# Patient Record
Sex: Male | Born: 1960 | Race: Black or African American | Hispanic: No | Marital: Married | Smoking: Current every day smoker
Health system: Southern US, Community
[De-identification: ages and names within clinical notes are randomized; demographics above are authoritative.]

## PROBLEM LIST (undated history)

## (undated) DIAGNOSIS — I10 Essential (primary) hypertension: Secondary | ICD-10-CM

## (undated) DIAGNOSIS — F17200 Nicotine dependence, unspecified, uncomplicated: Secondary | ICD-10-CM

## (undated) DIAGNOSIS — M1990 Unspecified osteoarthritis, unspecified site: Secondary | ICD-10-CM

## (undated) DIAGNOSIS — I639 Cerebral infarction, unspecified: Secondary | ICD-10-CM

## (undated) DIAGNOSIS — R011 Cardiac murmur, unspecified: Secondary | ICD-10-CM

## (undated) DIAGNOSIS — F1010 Alcohol abuse, uncomplicated: Secondary | ICD-10-CM

## (undated) HISTORY — PX: CARDIAC SURGERY: SHX584

## (undated) HISTORY — PX: HERNIA REPAIR: SHX51

## (undated) HISTORY — PX: BACK SURGERY: SHX140

---
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
## 1999-05-09 ENCOUNTER — Observation Stay (HOSPITAL_COMMUNITY): Admission: RE | Admit: 1999-05-09 | Discharge: 1999-05-10 | Payer: Self-pay | Admitting: Neurosurgery

## 1999-05-09 ENCOUNTER — Encounter: Payer: Self-pay | Admitting: Neurosurgery

## 2000-02-17 ENCOUNTER — Emergency Department (HOSPITAL_COMMUNITY): Admission: EM | Admit: 2000-02-17 | Discharge: 2000-02-18 | Payer: Self-pay | Admitting: Emergency Medicine

## 2007-08-02 ENCOUNTER — Emergency Department (HOSPITAL_COMMUNITY): Admission: EM | Admit: 2007-08-02 | Discharge: 2007-08-02 | Payer: Self-pay | Admitting: Emergency Medicine

## 2008-04-19 ENCOUNTER — Emergency Department (HOSPITAL_COMMUNITY): Admission: EM | Admit: 2008-04-19 | Discharge: 2008-04-19 | Payer: Self-pay | Admitting: Emergency Medicine

## 2008-09-25 ENCOUNTER — Emergency Department (HOSPITAL_COMMUNITY): Admission: EM | Admit: 2008-09-25 | Discharge: 2008-09-26 | Payer: Self-pay | Admitting: Emergency Medicine

## 2009-03-08 ENCOUNTER — Inpatient Hospital Stay (HOSPITAL_COMMUNITY): Admission: AD | Admit: 2009-03-08 | Discharge: 2009-03-12 | Payer: Self-pay | Admitting: Neurology

## 2009-03-08 ENCOUNTER — Encounter: Payer: Self-pay | Admitting: Emergency Medicine

## 2009-03-08 ENCOUNTER — Ambulatory Visit: Payer: Self-pay | Admitting: Cardiology

## 2009-03-09 ENCOUNTER — Encounter (INDEPENDENT_AMBULATORY_CARE_PROVIDER_SITE_OTHER): Payer: Self-pay | Admitting: Neurology

## 2009-03-10 ENCOUNTER — Encounter: Payer: Self-pay | Admitting: Cardiology

## 2009-03-11 ENCOUNTER — Ambulatory Visit: Payer: Self-pay | Admitting: *Deleted

## 2009-03-11 ENCOUNTER — Encounter (INDEPENDENT_AMBULATORY_CARE_PROVIDER_SITE_OTHER): Payer: Self-pay | Admitting: Neurology

## 2009-04-06 DIAGNOSIS — I6350 Cerebral infarction due to unspecified occlusion or stenosis of unspecified cerebral artery: Secondary | ICD-10-CM | POA: Insufficient documentation

## 2009-04-06 DIAGNOSIS — F17200 Nicotine dependence, unspecified, uncomplicated: Secondary | ICD-10-CM

## 2009-04-06 DIAGNOSIS — I669 Occlusion and stenosis of unspecified cerebral artery: Secondary | ICD-10-CM | POA: Insufficient documentation

## 2009-04-08 ENCOUNTER — Ambulatory Visit: Payer: Self-pay | Admitting: Cardiology

## 2009-04-08 DIAGNOSIS — Q249 Congenital malformation of heart, unspecified: Secondary | ICD-10-CM | POA: Insufficient documentation

## 2009-04-18 ENCOUNTER — Telehealth (INDEPENDENT_AMBULATORY_CARE_PROVIDER_SITE_OTHER): Payer: Self-pay | Admitting: *Deleted

## 2009-05-03 ENCOUNTER — Telehealth (INDEPENDENT_AMBULATORY_CARE_PROVIDER_SITE_OTHER): Payer: Self-pay | Admitting: *Deleted

## 2010-04-12 ENCOUNTER — Emergency Department (HOSPITAL_COMMUNITY): Admission: EM | Admit: 2010-04-12 | Discharge: 2010-04-12 | Payer: Self-pay | Admitting: Emergency Medicine

## 2010-12-25 LAB — CARDIOLIPIN ANTIBODIES, IGG, IGM, IGA
Anticardiolipin IgA: 9 [APL'U] — ABNORMAL LOW (ref <13)
Anticardiolipin IgG: <7 [GPL'U] — ABNORMAL LOW (ref <11)
Anticardiolipin IgM: <7 [MPL'U] — ABNORMAL LOW (ref <10)

## 2010-12-25 LAB — DIFFERENTIAL
Basophils Absolute: 0 10*3/uL (ref 0.0–0.1)
Lymphocytes Relative: 35 % (ref 12–46)
Neutro Abs: 2.2 10*3/uL (ref 1.7–7.7)

## 2010-12-25 LAB — ANA: Anti Nuclear Antibody(ANA): NEGATIVE

## 2010-12-25 LAB — POCT I-STAT 3, VENOUS BLOOD GAS (G3P V)
Acid-base deficit: 3 mmol/L — ABNORMAL HIGH (ref 0.0–2.0)
Acid-base deficit: 3 mmol/L — ABNORMAL HIGH (ref 0.0–2.0)
Acid-base deficit: 4 mmol/L — ABNORMAL HIGH (ref 0.0–2.0)
Bicarbonate: 19.9 mEq/L — ABNORMAL LOW (ref 20.0–24.0)
Bicarbonate: 22.7 mEq/L (ref 20.0–24.0)
O2 Saturation: 68 %
O2 Saturation: 69 %
O2 Saturation: 73 %
TCO2: 20 mmol/L (ref 0–100)
TCO2: 21 mmol/L (ref 0–100)
TCO2: 24 mmol/L (ref 0–100)
TCO2: 24 mmol/L (ref 0–100)
TCO2: 24 mmol/L (ref 0–100)
pCO2, Ven: 41.5 mmHg — ABNORMAL LOW (ref 45.0–50.0)
pCO2, Ven: 41.9 mmHg — ABNORMAL LOW (ref 45.0–50.0)
pCO2, Ven: 42 mmHg — ABNORMAL LOW (ref 45.0–50.0)
pH, Ven: 7.251 (ref 7.250–7.300)
pH, Ven: 7.288 (ref 7.250–7.300)
pH, Ven: 7.327 — ABNORMAL HIGH (ref 7.250–7.300)
pH, Ven: 7.351 — ABNORMAL HIGH (ref 7.250–7.300)
pO2, Ven: 36 mmHg (ref 30.0–45.0)
pO2, Ven: 38 mmHg (ref 30.0–45.0)
pO2, Ven: 39 mmHg (ref 30.0–45.0)

## 2010-12-25 LAB — LUPUS ANTICOAGULANT PANEL: Lupus Anticoagulant: NOT DETECTED

## 2010-12-25 LAB — ANTITHROMBIN III: AntiThromb III Func: 90 % (ref 76–126)

## 2010-12-25 LAB — CBC
HCT: 47.5 % (ref 39.0–52.0)
MCV: 102.7 fL — ABNORMAL HIGH (ref 78.0–100.0)
RBC: 4.63 MIL/uL (ref 4.22–5.81)
WBC: 4.1 10*3/uL (ref 4.0–10.5)

## 2010-12-25 LAB — APTT: aPTT: 31 seconds (ref 24–37)

## 2010-12-25 LAB — COMPREHENSIVE METABOLIC PANEL
BUN: 8 mg/dL (ref 6–23)
CO2: 24 mEq/L (ref 19–32)
Chloride: 107 mEq/L (ref 96–112)
Creatinine, Ser: 1.04 mg/dL (ref 0.4–1.5)
GFR calc non Af Amer: >60 mL/min (ref >60)
Total Bilirubin: 1.7 mg/dL — ABNORMAL HIGH (ref 0.3–1.2)

## 2010-12-25 LAB — PROTIME-INR
INR: 1 (ref 0.00–1.49)
INR: 0.9 (ref 0.00–1.49)

## 2010-12-25 LAB — HEMOGLOBIN A1C
Hgb A1c MFr Bld: 4.9 % (ref 4.6–6.1)
Mean Plasma Glucose: 94 mg/dL

## 2010-12-25 LAB — SEDIMENTATION RATE: Sed Rate: 2 mm/hr (ref 0–16)

## 2010-12-25 LAB — RAPID URINE DRUG SCREEN, HOSP PERFORMED: Barbiturates: NOT DETECTED

## 2010-12-25 LAB — D-DIMER, QUANTITATIVE: D-Dimer, Quant: 0.41 ug/mL-FEU (ref 0.00–0.48)

## 2010-12-25 LAB — BRAIN NATRIURETIC PEPTIDE: Pro B Natriuretic peptide (BNP): <30 pg/mL (ref 0.0–100.0)

## 2010-12-25 LAB — LIPID PANEL: Cholesterol: 130 mg/dL (ref 0–200)

## 2011-01-01 LAB — DIFFERENTIAL
Basophils Absolute: 0.1 10*3/uL (ref 0.0–0.1)
Basophils Relative: 1 % (ref 0–1)
Eosinophils Absolute: 0.2 10*3/uL (ref 0.0–0.7)
Monocytes Absolute: 0.3 10*3/uL (ref 0.1–1.0)
Neutro Abs: 1.6 10*3/uL — ABNORMAL LOW (ref 1.7–7.7)
Neutrophils Relative %: 34 % — ABNORMAL LOW (ref 43–77)

## 2011-01-01 LAB — CBC
Hemoglobin: 16 g/dL (ref 13.0–17.0)
MCHC: 33.1 g/dL (ref 30.0–36.0)
RDW: 13.6 % (ref 11.5–15.5)

## 2011-01-01 LAB — POCT CARDIAC MARKERS
Troponin i, poc: <0.05 ng/mL (ref 0.00–0.09)
Troponin i, poc: <0.05 ng/mL (ref 0.00–0.09)

## 2011-01-01 LAB — POCT I-STAT, CHEM 8
Calcium, Ion: 1.07 mmol/L — ABNORMAL LOW (ref 1.12–1.32)
Chloride: 106 mEq/L (ref 96–112)
HCT: 52 % (ref 39.0–52.0)
Hemoglobin: 17.7 g/dL — ABNORMAL HIGH (ref 13.0–17.0)

## 2011-01-30 NOTE — Discharge Summary (Signed)
NAMETAYDEN, Rodney Buchanan             ACCOUNT NO.:  [id number]   MEDICAL RECORD NO.:  [id number]          PATIENT TYPE:  INP   LOCATION:  3012                         FACILITY:  [facility]   PHYSICIAN:  Levert Feinstein, MD          DATE OF BIRTH:  September 09, 1961   DATE OF ADMISSION:  03/08/2009  DATE OF DISCHARGE:  03/12/2009                               DISCHARGE SUMMARY   DISCHARGE DIAGNOSES:  1. Left insular cortex stroke.  2. Left distal middle cerebral artery occlusion.  3. History of mitral valve replacement, not on any anticoagulation.  4. Hypertension.  5. Tobacco abuse.   DISCHARGE MEDICATIONS:  1. Aspirin 325 mg every day.  2. Nicotine patch.   HOSPITAL COURSE:  The patient is a 50 year old right-handed African  American male, who has mitral valve replacement at [place], presenting  acute onset of right-sided weakness, numbness, facial asymmetry, slurred  speech at 11 o'clock on March 08, 2009, symptoms resolved in 10 minutes,  but with mild slurred speech and facial weakness, persistent.   Upon admission, MRI of the brain has demonstrated acute stroke involving  the left insular cortex.  There was also evidence of distal left MCA  branch occlusion, especially the left posterior sylvian division  occluded 5 mm beyond its origin.  A 2-D echo has demonstrated ejection  fraction of 55-60%, and the septum was broad from the right to left  consistent with increased right atrial pressure, he later underwent TEE  with positive bubble study.  Also, left atrium was mildly dilated, right  atrium was moderately dilated, this has leading to the MRA of the  cardiac morphology.  MRI of the cardiac morphology has demonstrated  moderately dilated right ventricle with normal systolic function and  moderate right atrial enlargement.  There seems to have flow across the  intra-atrial septum, but does not seem to be due to AFO.  This has  leading to the right heart cardiac catheterization by Dr. Marca Ancona.  There is no significant step-up in oxygen saturation to suggest an  enlarged left to right shunt, the right atrium pressure is elevated, in  the absence of pulmonary hypertension.  The left atrium to right atrium  connection appeared to be small, and the Cardiology is going to get  records from his previous surgical evaluation in [place] for  comparison.   Upon discharge, the patient denied any focal neurological signs,  nonfocal neurological examination, and he is discharged home with  aspirin, nicotine patch for smoking cessation.  Follow up with Dr. Pearlean Brownie  and also cardiologist, Dr. Marca Ancona in 1-2 months.      Levert Feinstein, MD  Electronically Signed    YY/MEDQ  D:  03/12/2009  T:  03/12/2009  Job:  [id number]

## 2011-01-30 NOTE — Cardiovascular Report (Signed)
Rodney Buchanan, Rodney Buchanan             ACCOUNT NO.:  [id number]   MEDICAL RECORD NO.:  [id number]          PATIENT TYPE:  INP   LOCATION:  3012                         FACILITY:  [facility]   PHYSICIAN:  Marca Ancona, MD      DATE OF BIRTH:  03-03-1961   DATE OF PROCEDURE:  03/11/2009  DATE OF DISCHARGE:                            CARDIAC CATHETERIZATION   PROCEDURE:  Right heart catheterization with shunt run.   INDICATION:  Question ASD in the patient with enlarged right side of his  heart and positive bubble study and TEE.   PROCEDURE NOTE:  After informed consent was obtained, the right groin  was sterilely prepped and draped.  A 1% lidocaine was used to locally  anesthetize the right groin area.  A 7-French venous sheath was placed  using Seldinger technique in the right common femoral vein.  Right heart  catheterization was carried out using a balloon-tip Swan-Ganz catheter.  Samples were removed for oxygen saturation from the pulmonary artery,  the right atrium, the IVC.   FINDINGS:  1. Hemodynamics:  Mean right atrial pressure of 11 mmHg, RV 33/15, PA      30/12 for a mean PA pressure of 18 mmHg.  Mean pulmonary capillary      wedge pressure of 14 mmHg.  2. Oxygen saturations:  Aortic saturation is 96%, PA saturation is      71%, mid right atrial saturation is 68%, IVC saturation is 73%.  3. Cardiac output is 4 L/minute, cardiac index 2.16, Qp/Qs was      calculated to be 1.1 to 1.  4. Morphology of the right atrium, right ventricular dilated.   IMPRESSION:  There does not appear to be a significant step-up in oxygen  saturation suggesting that there is no significant left-to-right shunt.  The right atrial pressure is elevated in the absence of pulmonary  hypertension.  The left atrial to right atrial connection appears to be  small, it is either a small fenestration-type atrial septal defect or a  patent foramen ovale; however, it would be an unusual location for  patent  foramen ovale.  I think it would be okay to discharge the patient  today.  We will see him in follow up and review his records from his  surgery in [place].      Marca Ancona, MD  Electronically Signed     DM/MEDQ  D:  03/11/2009  T:  03/12/2009  Job:  [id number]

## 2011-01-30 NOTE — H&P (Signed)
Rodney Buchanan, Rodney Buchanan             ACCOUNT NO.:  [id number]   MEDICAL RECORD NO.:  [id number]          PATIENT TYPE:  INP   LOCATION:  3012                         FACILITY:  [facility]   PHYSICIAN:  Rodney P. Pearlean Brownie, MD    DATE OF BIRTH:  Jun 20, 1961   DATE OF ADMISSION:  03/08/2009  DATE OF DISCHARGE:                              HISTORY & PHYSICAL   REASON FOR REFERRAL:  TIA.   HISTORY OF PRESENT ILLNESS:  Rodney Buchanan is a 50 year old African-  American gentleman who developed the sudden onset of right-sided  weakness, numbness, slurred speech and facial asymmetry at 11:00 a.m.  today.  His symptoms started resolving in 10 minutes, and the right-  sided weakness and numbness improved, but his slurred speech and facial  weakness persisted.  He came to the emergency room and was found to be  improving by the ER physician.  CT scan of the head on admission showed  possible clot in the distal left middle cerebral artery with no acute  abnormality.  The patient has no prior history of stroke, TIA, seizures  or significant neurological problems.   PAST MEDICAL HISTORY:  1. Mitral valve replacement, but he is not on Coumadin.  He is not on      any medications.  2. Hypertension.  3. Smoking 1-1/2 packs per day.   HOME MEDICATIONS:  None.   MEDICATION ALLERGIES:  None.   FAMILY HISTORY:  Noncontributory.   SOCIAL HISTORY:  Smokes 1-1/2 packs per day.  He drinks alcohol  occasionally.  Denies doing drugs.   REVIEW OF SYSTEMS:  Negative for recent fever, cough, chest pain,  diarrhea or other illness.   PHYSICAL EXAMINATION:  GENERAL:  A pleasant middle-aged African-American  healthy-looking male who currently is not in distress.  VITAL SIGNS:  Temperature 98.6, blood pressure 124/79, heart rate 85 per  minute, respiratory rate 20 per minute, saturation 92% on room air.  HEENT:  Head is nontraumatic.  ENT exam is unremarkable.  NECK:  Supple.  There is no bruit.  CARDIAC:  No murmur or  gallop.  LUNGS:  Clear to auscultation.  ABDOMEN:  Soft, nontender.  NEUROLOGIC:  The patient is pleasant, awake, alert, cooperative.  There  is no aphasia, apraxia.  He does have mild dysarthria.  Eye movements  are full range.  There is no nystagmus.  Visual acuity and fields are  adequate.  He has right nasolabial fold asymmetry, facial weakness when  he smiles.  Tongue is midline.  Palatal movements normal.  Motor system  exam reveals no upper or lower extremity drift.  He has symmetric  strength, tone, reflexes and sensation.  Coordination is slightly  impaired in the right hand.  He has weakness of the right hand intrinsic  hand muscles with fine finger movement diminished.  He does not  __________ left or right upper extremity.  Gait was not tested.   DATA REVIEWED:  Noncontrast CAT scan of the head done today revealed  some hyperdensity in the left distal sylvian fissure, probably a clot.  No acute stroke is noted.   IMPRESSION:  A 50 year old gentleman with a left middle cerebral artery  branch infarction, likely a distal MCA clot.  The patient has obtained  near substantial improvement but has mild residual facial asymmetry and  slurred speech.  The patient had presented beyond 3 hours but within 12  hours of onset of his symptoms.   PLAN:  The patient is being admitted to [facility] for further  stroke workup.  We will check MRI scan of the brain with MRA of the  brain, carotid ultrasound, 2-D echocardiogram, fasting lipid profile,  hemoglobin A1c and homocysteine.  He may consider participating in the  POINT stroke trial.  I have discussed this with him and he expressed  some interest.  We will see if he meets inclusion criteria to qualify.  He will need to be on antiplatelet therapy, as well as aggressive risk  factor modification.  Will need to obtain more information about his  mitral valve replacement to see if he merits long-term anticoagulation  or  antiplatelet therapy.           ______________________________  Sunny Schlein. Pearlean Brownie, MD     PPS/MEDQ  D:  03/08/2009  T:  03/09/2009  Job:  [id number]

## 2011-01-30 NOTE — Discharge Summary (Signed)
NAMESHIELDS, PAUTZ             ACCOUNT NO.:  [id number]   MEDICAL RECORD NO.:  [id number]          PATIENT TYPE:  INP   LOCATION:  3012                         FACILITY:  [facility]   PHYSICIAN:  Marca Ancona, MD      DATE OF BIRTH:  18-Nov-1960   DATE OF ADMISSION:  03/08/2009  DATE OF DISCHARGE:                               DISCHARGE SUMMARY   This is completed to be available for right heart catheterization  completed within the next hour.   PRIMARY CARDIOLOGIST:  Marca Ancona, MD   PRIMARY CARE PHYSICIAN:  Not listed.   REQUESTING PHYSICIAN:  Pramod P. Pearlean Brownie, MD, Neurology.   REASON FOR CONSULTATION:  CVA status post TEE needing right heart  catheterization for right heart enlargement.   HISTORY OF PRESENT ILLNESS:  This is a 50 year old African American male  with no history of coronary artery disease, but a remote history of  mitral valve repair at the age of 28 in [place] who had sudden onset of  right-sided weakness, numbness, slurred speech, and facial asymmetry  around 11 a.m.  The patient's symptoms resolved approximately 10 minutes  later with right-sided weakness and numbness improving, but the slurred  speech and facial weakness persisted, therefore he was seen in the  emergency room at [facility].  The patient on arrival, his  blood pressure was 124/79, heart rate of 85.  The patient had neuro  consult concerning this and he had a CT head completed revealing slight  increased density within the left MCA within the sylvian fissure.  The  patient could not be excluded for MCA signed thrombus.  He had a  subsequent MR of the brain, which revealed occlusion or near-occlusion  of the left MCA, M2 segment posterior psyllium branch with poor flow  downstream vessels, otherwise negative intracranial MRA.   The patient was transferred to [facility] where he was seen and  examined by Dr. Pearlean Brownie and Cardiology was requested to do a TEE in the  setting of  a CVA.   Dr. Shirlee Latch did complete a TEE for this patient on March 10, 2009, with  results revealing right-sided heart enlargement, cavity size dilated,  systolic function mildly reduced, right atrium moderately dilated,  atrial septum both from right to left consistent with increased right  atrial pressure, mitral valve calcified annulus, and mitral mild  regurgitation.  The LVEF is 55%.  He did have a positive bubble study  and right heart catheterization was recommended.   REVIEW OF SYSTEMS:  Positive for right-sided weakness and slurred speech  with numbness and tingling on the right side with sudden onset, negative  for chest pain, shortness of breath, nausea, vomiting, or diaphoresis.  All other systems have been reviewed and found to be negative.   PAST MEDICAL HISTORY:  1. Hypertension.  2. Mitral valve repair as a child in [place].  3. Ongoing tobacco abuse.   SOCIAL HISTORY:  He lives in [place].  He is a long-time smoker.  Positive for EtOH, negative for drug use.   FAMILY HISTORY:  Not available at this time.  CURRENT MEDICATIONS:  Aspirin 325 mg daily, low-molecular-weight heparin  daily.   ALLERGIES:  No known drug allergies.   CURRENT LABORATORIES:  Sodium 140, potassium 4.2, chloride 107, CO2 of  24, BUN 8, creatinine 1.0, glucose 87.  Hemoglobin 16.2, hematocrit  47.5, white blood cells 4.2, platelets 238.  D-dimer 0.41, hemoglobin  A1c 4.9.  BNP less than 30.  Troponin less than 0.05 respectively.  PTT  31.  PT 13.6, INR 1.0.  Chest x-ray revealing no active disease.   PHYSICAL EXAMINATION:  VITAL SIGNS:  Blood pressure 132/81, pulse 41,  respirations 18, temperature 99, O2 sat 96% on room air.  GENERAL:  He is awake, alert, and oriented.  HEENT:  Head is normocephalic and atraumatic.  Eyes PERRLA.  Mucous  membranes, mouth pink and moist.  Tongue is midline.  NECK:  Supple.  No JVD.  No carotid bruits are appreciated.  CARDIOVASCULAR:  Regular rate and  rhythm without evidence of murmurs,  rubs, or gallops at present.  LUNGS:  Clear to auscultation.  ABDOMEN:  Soft, nontender with 2+ bowel sounds.  EXTREMITIES:  Without clubbing, cyanosis, edema, or weakness at this  time.   IMPRESSION:  1. Status post cerebrovascular accident with right-sided weakness.  2. Status post TEE with right heart enlargement.  3. Hypertension.  4. Ongoing tobacco abuse.   PLAN:  This is a very pleasant 50 year old African American male with  known history of hypertension and mitral valve repair as a child, but no  history of coronary artery disease.  The patient was seen and examined  by Dr. Marca Ancona and a TEE was completed on March 10, 2009, revealing  right-sided heart enlargement, bubble study positive, the bubble did not  cross in the typical area for PFO, but we were unable to localize the  defect.  Cardiac MRI was done to follow up and this confirmed right-  sided dilatation.  Again, there is no suggestion of a flow across the  intra-atrial septum, but I am unable to locate a discrete lesion,  questionable small fenestrations within the atrial septum.   It is our recommendation that he needs a right heart catheterization  with shunt given right-sided dilatation, stroke, and shunt lesion that  has not been fully characterized.  We will get his records from prior CT  surgery as a child and follow up after right heart catheterization for  more recommendations.  We have explained this to the patient who  verbalizes understanding and is willing to proceed with catheterization.      Bettey Mare. Lyman Bishop, NP      Marca Ancona, MD  Electronically Signed    KML/MEDQ  D:  03/11/2009  T:  03/11/2009  Job:  [id number]

## 2011-09-02 ENCOUNTER — Emergency Department (HOSPITAL_COMMUNITY)
Admission: EM | Admit: 2011-09-02 | Discharge: 2011-09-03 | Disposition: A | Discharge status: Home / Self Care | Payer: Self-pay | Attending: Emergency Medicine | Admitting: Emergency Medicine

## 2011-09-02 ENCOUNTER — Other Ambulatory Visit: Payer: Self-pay

## 2011-09-02 ENCOUNTER — Encounter: Payer: Self-pay | Admitting: *Deleted

## 2011-09-02 ENCOUNTER — Emergency Department (HOSPITAL_COMMUNITY): Payer: Self-pay

## 2011-09-02 DIAGNOSIS — R4182 Altered mental status, unspecified: Secondary | ICD-10-CM | POA: Insufficient documentation

## 2011-09-02 DIAGNOSIS — W19XXXA Unspecified fall, initial encounter: Secondary | ICD-10-CM | POA: Insufficient documentation

## 2011-09-02 DIAGNOSIS — Y9289 Other specified places as the place of occurrence of the external cause: Secondary | ICD-10-CM | POA: Insufficient documentation

## 2011-09-02 DIAGNOSIS — T50901A Poisoning by unspecified drugs, medicaments and biological substances, accidental (unintentional), initial encounter: Secondary | ICD-10-CM | POA: Insufficient documentation

## 2011-09-02 DIAGNOSIS — F10929 Alcohol use, unspecified with intoxication, unspecified: Secondary | ICD-10-CM

## 2011-09-02 DIAGNOSIS — F17200 Nicotine dependence, unspecified, uncomplicated: Secondary | ICD-10-CM | POA: Insufficient documentation

## 2011-09-02 DIAGNOSIS — S5000XA Contusion of unspecified elbow, initial encounter: Secondary | ICD-10-CM | POA: Insufficient documentation

## 2011-09-02 DIAGNOSIS — T50904A Poisoning by unspecified drugs, medicaments and biological substances, undetermined, initial encounter: Secondary | ICD-10-CM | POA: Insufficient documentation

## 2011-09-02 DIAGNOSIS — F1010 Alcohol abuse, uncomplicated: Secondary | ICD-10-CM | POA: Insufficient documentation

## 2011-09-02 LAB — COMPREHENSIVE METABOLIC PANEL
ALT: 35 U/L (ref 0–53)
AST: 36 U/L (ref 0–37)
Albumin: 4.1 g/dL (ref 3.5–5.2)
Alkaline Phosphatase: 103 U/L (ref 39–117)
BUN: 14 mg/dL (ref 6–23)
CO2: 21 mEq/L (ref 19–32)
Calcium: 9.2 mg/dL (ref 8.4–10.5)
Chloride: 102 mEq/L (ref 96–112)
Creatinine, Ser: 0.86 mg/dL (ref 0.50–1.35)
GFR calc Af Amer: >90 mL/min (ref >90)
GFR calc non Af Amer: >90 mL/min (ref >90)
Glucose, Bld: 93 mg/dL (ref 70–99)
Potassium: 3.8 mEq/L (ref 3.5–5.1)
Sodium: 137 mEq/L (ref 135–145)
Total Bilirubin: 0.6 mg/dL (ref 0.3–1.2)
Total Protein: 7.5 g/dL (ref 6.0–8.3)

## 2011-09-02 LAB — RAPID URINE DRUG SCREEN, HOSP PERFORMED
Amphetamines: NOT DETECTED
Barbiturates: NOT DETECTED
Benzodiazepines: NOT DETECTED
Cocaine: NOT DETECTED
Opiates: NOT DETECTED
Tetrahydrocannabinol: NOT DETECTED

## 2011-09-02 LAB — URINALYSIS, ROUTINE W REFLEX MICROSCOPIC
Bilirubin Urine: NEGATIVE
Glucose, UA: NEGATIVE mg/dL
Hgb urine dipstick: NEGATIVE
Ketones, ur: NEGATIVE mg/dL
Leukocytes, UA: NEGATIVE
Nitrite: NEGATIVE
Protein, ur: NEGATIVE mg/dL
Specific Gravity, Urine: 1.006 (ref 1.005–1.030)
Urobilinogen, UA: 0.2 mg/dL (ref 0.0–1.0)
pH: 5.5 (ref 5.0–8.0)

## 2011-09-02 LAB — CBC
HCT: 43 % (ref 39.0–52.0)
Hemoglobin: 15.4 g/dL (ref 13.0–17.0)
MCH: 33.9 pg (ref 26.0–34.0)
MCHC: 35.8 g/dL (ref 30.0–36.0)
MCV: 94.7 fL (ref 78.0–100.0)
Platelets: 219 10*3/uL (ref 150–400)
RBC: 4.54 MIL/uL (ref 4.22–5.81)
RDW: 12.6 % (ref 11.5–15.5)
WBC: 5.5 10*3/uL (ref 4.0–10.5)

## 2011-09-02 LAB — SALICYLATE LEVEL: Salicylate Lvl: <2 mg/dL — ABNORMAL LOW (ref 2.8–20.0)

## 2011-09-02 LAB — TROPONIN I: Troponin I: <0.3 ng/mL (ref <0.30)

## 2011-09-02 LAB — ACETAMINOPHEN LEVEL: Acetaminophen (Tylenol), Serum: <15 ug/mL (ref 10–30)

## 2011-09-02 LAB — AMMONIA: Ammonia: 22 umol/L (ref 11–60)

## 2011-09-02 LAB — ETHANOL: Alcohol, Ethyl (B): 311 mg/dL — ABNORMAL HIGH (ref 0–11)

## 2011-09-02 MED ORDER — SODIUM CHLORIDE 0.9 % IV BOLUS (SEPSIS)
1000.0000 mL | Freq: Once | INTRAVENOUS | Status: AC
  Administered 2011-09-02: 1000 mL via INTRAVENOUS

## 2011-09-02 MED ORDER — NALOXONE HCL 0.4 MG/ML IJ SOLN: 0.4000 mg | INTRAMUSCULAR | Status: DC | PRN

## 2011-09-02 NOTE — ED Notes (Signed)
EDP [place] present for evaluation of this pt.  Pt has had multiple episodes of not responding from verbal commands and painful stimulation.  EDP witnessed this episode.  Neg stroke and seizure.  ?psych.  Charge Haematologist updated on pt current status.  Pt able to maintain ABC during episode- will continue to monitor

## 2011-09-02 NOTE — ED Provider Notes (Signed)
History    Pt found down in road with respiratory depression. Unknown how long down or circumstances of. Apparently responded to narcan and responding appropriately before becoming unresponsive again less than 10 minutes later. Pt with similar episodes in ED. No seizure activity. No incontinence. No tongue biting. Would avoid noxious stimuli during these episodes. Episodes lasted a couple minutes with rapid return to baseline. Suspect willfull. Pt admits to drinking beer and liquor. Denies other ingestion. Denies drug use. Doesn't think trauma. No fever or chills. Denies pain anywhere. No sob. No numbness, tingling or loss of strength.   CSN: [id number] Arrival date & time: 09/02/2011  9:46 PM   First MD Initiated Contact with Patient 09/02/11 2148      Chief Complaint  Patient presents with  . Drug Overdose    probable heroin  . Medical Clearance    (Consider location/radiation/quality/duration/timing/severity/associated sxs/prior treatment) HPI  No past medical history on file.  No past surgical history on file.  No family history on file.  History  Substance Use Topics  . Smoking status: Current Everyday Smoker    Types: Cigarettes  . Smokeless tobacco: Not on file  . Alcohol Use: Yes      Review of Systems   Review of symptoms negative unless otherwise noted in HPI.   Allergies  Review of patient's allergies indicates no known allergies.  Home Medications   Current Outpatient Rx  Name Route Sig Dispense Refill  . ASPIRIN 81 MG PO CHEW Oral Chew 81 mg by mouth daily.        BP 109/60  Pulse 64  Resp 13  SpO2 96%  Physical Exam  Nursing note and vitals reviewed. Constitutional: He is oriented to person, place, and time. He appears well-developed and well-nourished. No distress.       etoh on breath  HENT:  Head: Normocephalic and atraumatic.  Right Ear: External ear normal.  Left Ear: External ear normal.  Mouth/Throat: Oropharynx is clear and moist.   Eyes: Conjunctivae are normal. Pupils are equal, round, and reactive to light. Right eye exhibits no discharge. Left eye exhibits no discharge.  Neck: Normal range of motion. Neck supple.  Cardiovascular: Normal rate, regular rhythm and normal heart sounds.  Exam reveals no gallop and no friction rub.   No murmur heard. Pulmonary/Chest: Effort normal and breath sounds normal. No respiratory distress. He has no wheezes. He exhibits no tenderness.       Large well healed sternotomy scar  Abdominal: Soft. He exhibits no distension. There is no tenderness. There is no rebound.  Musculoskeletal: He exhibits no edema and no tenderness.  Lymphadenopathy:    He has no cervical adenopathy.  Neurological: He is alert and oriented to person, place, and time. No cranial nerve deficit. He exhibits normal muscle tone. Coordination normal.  Skin: Skin is warm and dry. He is not diaphoretic.  Psychiatric: He has a normal mood and affect. His behavior is normal. Thought content normal.    ED Course  Procedures (including critical care time)  Labs Reviewed  ETHANOL - Abnormal; Notable for the following:    Alcohol, Ethyl (B) 311 (*)    All other components within normal limits  SALICYLATE LEVEL - Abnormal; Notable for the following:    Salicylate Lvl <2.0 (*)    All other components within normal limits  COMPREHENSIVE METABOLIC PANEL  CBC  URINE RAPID DRUG SCREEN (HOSP PERFORMED)  ACETAMINOPHEN LEVEL  AMMONIA  URINALYSIS, ROUTINE W REFLEX MICROSCOPIC  TROPONIN  I   Ct Head Wo Contrast  09/02/2011  *RADIOLOGY REPORT*  Clinical Data: Drug overdose, medical clearance  CT HEAD WITHOUT CONTRAST  Technique:  Contiguous axial images were obtained from the base of the skull through the vertex without contrast.  Comparison: [place] CT head dated 04/13/2011  Findings: No evidence of parenchymal hemorrhage or extra-axial fluid collection. No mass lesion, mass effect, or midline shift.  No CT evidence of  acute infarction.  Cerebral volume is age appropriate.  No ventriculomegaly.  The visualized paranasal sinuses are essentially clear. The mastoid air cells are unopacified.  No evidence of calvarial fracture.  IMPRESSION: No evidence of acute intracranial abnormality.  Original Report Authenticated By: Charline Bills, M.D.   EKG:  Rhythm: normal sinus Rate: 81 Axis: normal Intervals: 1st degree av block ST segments: normal   1. Alcohol intoxication   2. Alcohol abuse   3. Altered mental status   4. Fall   5. Contusion, elbow       MDM  50yM brought in after being found down. Consider seizure, overdose/intoxication, trauma, psych. +etoh. Denies drug use and drug screen neg. Pt behavior very bizzare and suggestive of specific toxidrome. Witnessed and does not appear to be seizure. Head CT neg. Nonfocal neuro exam otherwise. Only trauma is small abrasion/contusion to elbow. Suspect psych component. At baseline per wife who is at bedside. Pt requesting to go home and will be discharged in care of wife.        Raeford Razor, MD 09/03/11 [phone]

## 2011-09-02 NOTE — ED Notes (Signed)
WUJ:[id number]<JY> Expected date:09/02/11<BR> Expected time: 9:18 PM<BR> Means of arrival:Ambulance<BR> Comments:<BR> M262 - 48yoM Found unresponsive RR 4, responded to narcan, now CAOx4

## 2011-09-02 NOTE — ED Notes (Signed)
Found unresponsive in the middle of road - breathing 4x per minutes, pinpoint pupils, 2mg  narcan in naostrils, came aroused . Placed 16 gauge in left a/c , placed on oxygen . Transport to ED- Etoh scent on breath

## 2012-03-19 ENCOUNTER — Emergency Department (HOSPITAL_COMMUNITY)
Admission: EM | Admit: 2012-03-19 | Discharge: 2012-03-19 | Discharge status: Against Medical Advice | Payer: Self-pay | Attending: Emergency Medicine | Admitting: Emergency Medicine

## 2012-03-19 ENCOUNTER — Encounter (HOSPITAL_COMMUNITY): Payer: Self-pay

## 2012-03-19 DIAGNOSIS — R4182 Altered mental status, unspecified: Secondary | ICD-10-CM | POA: Insufficient documentation

## 2012-03-19 DIAGNOSIS — Z86718 Personal history of other venous thrombosis and embolism: Secondary | ICD-10-CM | POA: Insufficient documentation

## 2012-03-19 DIAGNOSIS — Z7982 Long term (current) use of aspirin: Secondary | ICD-10-CM | POA: Insufficient documentation

## 2012-03-19 DIAGNOSIS — F10929 Alcohol use, unspecified with intoxication, unspecified: Secondary | ICD-10-CM

## 2012-03-19 DIAGNOSIS — F17200 Nicotine dependence, unspecified, uncomplicated: Secondary | ICD-10-CM | POA: Insufficient documentation

## 2012-03-19 HISTORY — DX: Cerebral infarction, unspecified: I63.9

## 2012-03-19 NOTE — ED Notes (Signed)
[id number]<NF> Expected date:03/19/12<BR> Expected time: 1:39 AM<BR> Means of arrival:Ambulance<BR> Comments:<BR> Found in road/Immobilized

## 2012-03-19 NOTE — ED Provider Notes (Signed)
History     CSN: [id number]  Arrival date & time 03/19/12  0152   First MD Initiated Contact with Patient 03/19/12 0220      Chief Complaint  Patient presents with  . Altered Mental Status    (Consider location/radiation/quality/duration/timing/severity/associated sxs/prior treatment) Patient is a 51 y.o. male presenting with altered mental status. The history is provided by the patient and the EMS personnel. The history is limited by the condition of the patient.  Altered Mental Status   patient found in the street unresponsive by EMS. Patient admitted to taking heavy EtOH this evening. No signs of trauma. CBg according to EMS was 126. Patient was arousable and was transported here. He has been uncooperative but denies any complaints.  Past Medical History  Diagnosis Date  . Stroke     x7    Past Surgical History  Procedure Date  . Cardiac surgery     Murmur Repair    History reviewed. No pertinent family history.  History  Substance Use Topics  . Smoking status: Current Everyday Smoker    Types: Cigarettes  . Smokeless tobacco: Not on file  . Alcohol Use: Yes      Review of Systems  Psychiatric/Behavioral: Positive for altered mental status.  All other systems reviewed and are negative.    Allergies  Review of patient's allergies indicates no known allergies.  Home Medications   Current Outpatient Rx  Name Route Sig Dispense Refill  . ASPIRIN 81 MG PO CHEW Oral Chew 81 mg by mouth daily.        BP 133/78  Pulse 69  Temp 97.5 F (36.4 C) (Oral)  Resp 18  SpO2 93%  Physical Exam  Nursing note and vitals reviewed. Constitutional: He is oriented to person, place, and time. He appears well-developed and well-nourished.  Non-toxic appearance. No distress.  HENT:  Head: Normocephalic and atraumatic.  Eyes: Conjunctivae, EOM and lids are normal. Pupils are equal, round, and reactive to light.  Neck: Normal range of motion. Neck supple. No tracheal  deviation present. No mass present.  Cardiovascular: Normal rate, regular rhythm and normal heart sounds.  Exam reveals no gallop.   No murmur heard. Pulmonary/Chest: Effort normal and breath sounds normal. No stridor. No respiratory distress. He has no decreased breath sounds. He has no wheezes. He has no rhonchi. He has no rales.  Abdominal: Soft. Normal appearance and bowel sounds are normal. He exhibits no distension. There is no tenderness. There is no rebound and no CVA tenderness.  Musculoskeletal: Normal range of motion. He exhibits no edema and no tenderness.  Neurological: He is alert and oriented to person, place, and time. No cranial nerve deficit or sensory deficit. GCS eye subscore is 4. GCS verbal subscore is 5. GCS motor subscore is 6.  Skin: Skin is warm and dry. No abrasion and no rash noted.  Psychiatric: His speech is normal. His affect is blunt. He is slowed.    ED Course  Procedures (including critical care time)  Labs Reviewed - No data to display No results found.   No diagnosis found.    MDM  Patient has walked out and left AMA in the company of his wife. No acute medical condition has been identified        Toy Baker, MD 03/19/12 0430

## 2012-03-19 NOTE — ED Notes (Signed)
Left message on wife's phone for pickup

## 2012-03-19 NOTE — ED Notes (Signed)
Pt found lying in the middle of the street.  Decreased LOC with ETOH on board.  Not responding on EMS arrival.   Vitals 120 b/p palpated, pule 80, 12 leak unremarkable.  No IV in route.  Pt states 4 shots of liquor.

## 2012-03-19 NOTE — ED Notes (Signed)
Pt on LSB and neck brace upon arrival.  Per Dr.  Freida Busman, ok to remove board.  Leave neck brace.  Once pt board removed, pt removed neck brace by self and refused to keep it on.  Pt clothes cut off during transport. Pt has black knife at nurses station.

## 2012-03-19 NOTE — ED Notes (Signed)
Pt's wife here to pick him up. Pt swearing at staff. Security called. Pt left AMA with wife. Dr. Freida Busman aware. Security escorted pt out. Pt ambulatory with steady gait at d/c. Swearing at staff as he walked out of ED.

## 2013-12-23 ENCOUNTER — Emergency Department (INDEPENDENT_AMBULATORY_CARE_PROVIDER_SITE_OTHER)
Admission: EM | Admit: 2013-12-23 | Discharge: 2013-12-23 | Disposition: A | Discharge status: Home / Self Care | Payer: Self-pay | Source: Home / Self Care | Attending: Family Medicine | Admitting: Family Medicine

## 2013-12-23 ENCOUNTER — Encounter (HOSPITAL_COMMUNITY): Payer: Self-pay | Admitting: Emergency Medicine

## 2013-12-23 DIAGNOSIS — L723 Sebaceous cyst: Secondary | ICD-10-CM

## 2013-12-23 DIAGNOSIS — L089 Local infection of the skin and subcutaneous tissue, unspecified: Secondary | ICD-10-CM

## 2013-12-23 MED ORDER — CEPHALEXIN 500 MG PO CAPS: 500.0000 mg | ORAL_CAPSULE | Freq: Three times a day (TID) | ORAL | Status: DC

## 2013-12-23 NOTE — Discharge Instructions (Signed)
Warm compress twice a day when you take the antibiotic, take all of medicine, return as needed. °

## 2013-12-23 NOTE — ED Notes (Signed)
Swollen painful area on mid upper back for past couple of days

## 2013-12-23 NOTE — ED Provider Notes (Signed)
CSN: [id number]     Arrival date & time 12/23/13  1859 History   First MD Initiated Contact with Patient 12/23/13 2010     Chief Complaint  Patient presents with  . Skin Problem   (Consider location/radiation/quality/duration/timing/severity/associated sxs/prior Treatment) Patient is a 53 y.o. male presenting with abscess. The history is provided by the patient.  Abscess Location:  Torso Torso abscess location:  Upper back Abscess quality: fluctuance, induration and painful   Red streaking: no   Duration:  3 days Progression:  Worsening Pain details:    Severity:  Mild   Progression:  Worsening Chronicity:  New Context comment:  Prior lesion started enlarging over past few days.   Past Medical History  Diagnosis Date  . Stroke     x7   Past Surgical History  Procedure Laterality Date  . Cardiac surgery      Murmur Repair   History reviewed. No pertinent family history. History  Substance Use Topics  . Smoking status: Current Every Day Smoker    Types: Cigarettes  . Smokeless tobacco: Not on file  . Alcohol Use: Yes    Review of Systems  Constitutional: Negative.   Skin: Positive for wound.    Allergies  Review of patient's allergies indicates no known allergies.  Home Medications   Current Outpatient Rx  Name  Route  Sig  Dispense  Refill  . aspirin 81 MG chewable tablet   Oral   Chew 81 mg by mouth daily.           . cephALEXin (KEFLEX) 500 MG capsule   Oral   Take 1 capsule (500 mg total) by mouth 3 (three) times daily. Take all of medicine and drink lots of fluids   21 capsule   0    BP 128/73  Pulse 67  Temp(Src) 98.2 F (36.8 C) (Oral)  Resp 16  SpO2 92% Physical Exam  Nursing note and vitals reviewed. Constitutional: He is oriented to person, place, and time. He appears well-developed and well-nourished.  Neurological: He is alert and oriented to person, place, and time.  Skin: Skin is warm and dry. There is erythema.  Fluctuant  abscess on mid back    ED Course  INCISION AND DRAINAGE Date/Time: 12/23/2013 8:34 PM Performed by: Billy Fischer Authorized by: Ihor Gully D Consent: Verbal consent obtained. Risks and benefits: risks, benefits and alternatives were discussed Consent given by: patient Type: abscess Body area: trunk Location details: back Local anesthetic: topical anesthetic Patient sedated: no Scalpel size: 11 Incision type: single straight Complexity: simple Drainage: purulent Drainage amount: copious Wound treatment: wound left open Packing material: 1/2 in gauze Patient tolerance: Patient tolerated the procedure well with no immediate complications. Comments: Culture obtained.   (including critical care time) Labs Review Labs Reviewed  CULTURE, ROUTINE-ABSCESS   Imaging Review No results found.   MDM   1. Infected sebaceous cyst of skin        Billy Fischer, MD 12/23/13 2038

## 2013-12-27 LAB — CULTURE, ROUTINE-ABSCESS: Culture: NO GROWTH

## 2014-11-28 ENCOUNTER — Emergency Department (HOSPITAL_COMMUNITY)
Admission: EM | Admit: 2014-11-28 | Discharge: 2014-11-28 | Disposition: A | Discharge status: Home / Self Care | Payer: Self-pay | Attending: Emergency Medicine | Admitting: Emergency Medicine

## 2014-11-28 ENCOUNTER — Encounter (HOSPITAL_COMMUNITY): Payer: Self-pay | Admitting: *Deleted

## 2014-11-28 DIAGNOSIS — Z8673 Personal history of transient ischemic attack (TIA), and cerebral infarction without residual deficits: Secondary | ICD-10-CM | POA: Insufficient documentation

## 2014-11-28 DIAGNOSIS — F10129 Alcohol abuse with intoxication, unspecified: Secondary | ICD-10-CM | POA: Insufficient documentation

## 2014-11-28 DIAGNOSIS — R780 Finding of alcohol in blood: Secondary | ICD-10-CM

## 2014-11-28 DIAGNOSIS — Z9889 Other specified postprocedural states: Secondary | ICD-10-CM | POA: Insufficient documentation

## 2014-11-28 DIAGNOSIS — Y900 Blood alcohol level of less than 20 mg/100 ml: Secondary | ICD-10-CM | POA: Insufficient documentation

## 2014-11-28 DIAGNOSIS — Z720 Tobacco use: Secondary | ICD-10-CM | POA: Insufficient documentation

## 2014-11-28 HISTORY — DX: Alcohol abuse, uncomplicated: F10.10

## 2014-11-28 LAB — COMPREHENSIVE METABOLIC PANEL
ALBUMIN: 4.4 g/dL (ref 3.5–5.2)
ALT: 32 U/L (ref 0–53)
ANION GAP: 8 (ref 5–15)
AST: 38 U/L — ABNORMAL HIGH (ref 0–37)
Alkaline Phosphatase: 105 U/L (ref 39–117)
BUN: 14 mg/dL (ref 6–23)
CALCIUM: 8.6 mg/dL (ref 8.4–10.5)
CO2: 22 mmol/L (ref 19–32)
Chloride: 110 mmol/L (ref 96–112)
Creatinine, Ser: 0.95 mg/dL (ref 0.50–1.35)
GLUCOSE: 68 mg/dL — AB (ref 70–99)
Potassium: 4.4 mmol/L (ref 3.5–5.1)
SODIUM: 140 mmol/L (ref 135–145)
TOTAL PROTEIN: 7.4 g/dL (ref 6.0–8.3)
Total Bilirubin: 1 mg/dL (ref 0.3–1.2)

## 2014-11-28 LAB — URINALYSIS, ROUTINE W REFLEX MICROSCOPIC
Bilirubin Urine: NEGATIVE
GLUCOSE, UA: NEGATIVE mg/dL
Hgb urine dipstick: NEGATIVE
KETONES UR: NEGATIVE mg/dL
LEUKOCYTES UA: NEGATIVE
NITRITE: NEGATIVE
PROTEIN: NEGATIVE mg/dL
SPECIFIC GRAVITY, URINE: 1.003 — AB (ref 1.005–1.030)
Urobilinogen, UA: 1 mg/dL (ref 0.0–1.0)
pH: 5.5 (ref 5.0–8.0)

## 2014-11-28 LAB — CBC WITH DIFFERENTIAL/PLATELET
BASOS ABS: 0.1 10*3/uL (ref 0.0–0.1)
BASOS PCT: 1 % (ref 0–1)
EOS PCT: 2 % (ref 0–5)
Eosinophils Absolute: 0.1 10*3/uL (ref 0.0–0.7)
HCT: 46.2 % (ref 39.0–52.0)
Hemoglobin: 16.2 g/dL (ref 13.0–17.0)
LYMPHS PCT: 28 % (ref 12–46)
Lymphs Abs: 1.6 10*3/uL (ref 0.7–4.0)
MCH: 34.8 pg — ABNORMAL HIGH (ref 26.0–34.0)
MCHC: 35.1 g/dL (ref 30.0–36.0)
MCV: 99.1 fL (ref 78.0–100.0)
Monocytes Absolute: 1.1 10*3/uL — ABNORMAL HIGH (ref 0.1–1.0)
Monocytes Relative: 20 % — ABNORMAL HIGH (ref 3–12)
NEUTROS ABS: 2.7 10*3/uL (ref 1.7–7.7)
NEUTROS PCT: 49 % (ref 43–77)
Platelets: 179 10*3/uL (ref 150–400)
RBC: 4.66 MIL/uL (ref 4.22–5.81)
RDW: 13.2 % (ref 11.5–15.5)
WBC: 5.6 10*3/uL (ref 4.0–10.5)

## 2014-11-28 LAB — RAPID URINE DRUG SCREEN, HOSP PERFORMED
AMPHETAMINES: NOT DETECTED
Barbiturates: NOT DETECTED
Benzodiazepines: NOT DETECTED
COCAINE: NOT DETECTED
Opiates: NOT DETECTED
TETRAHYDROCANNABINOL: NOT DETECTED

## 2014-11-28 LAB — ETHANOL: ALCOHOL ETHYL (B): 301 mg/dL — AB (ref 0–9)

## 2014-11-28 MED ORDER — AMMONIA AROMATIC IN INHA
RESPIRATORY_TRACT | Status: AC
  Administered 2014-11-28: 17:00:00
  Filled 2014-11-28: qty 20

## 2014-11-28 NOTE — ED Notes (Signed)
Bed: [facility]B Expected date:  Expected time:  Means of arrival:  Comments: Ems- unresponsive/ narcan
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
## 2014-11-28 NOTE — Discharge Instructions (Signed)
Alcohol Intoxication  Alcohol intoxication occurs when the amount of alcohol that a person has consumed impairs his or her ability to mentally and physically function. Alcohol directly impairs the normal chemical activity of the brain. Drinking large amounts of alcohol can lead to changes in mental function and behavior, and it can cause many physical effects that can be harmful.   Alcohol intoxication can range in severity from mild to very severe. Various factors can affect the level of intoxication that occurs, such as the person's age, gender, weight, frequency of alcohol consumption, and the presence of other medical conditions (such as diabetes, seizures, or heart conditions). Dangerous levels of alcohol intoxication may occur when people drink large amounts of alcohol in a short period (binge drinking). Alcohol can also be especially dangerous when combined with certain prescription medicines or "recreational" drugs.  SIGNS AND SYMPTOMS  Some common signs and symptoms of mild alcohol intoxication include:  · Loss of coordination.  · Changes in mood and behavior.  · Impaired judgment.  · Slurred speech.  As alcohol intoxication progresses to more severe levels, other signs and symptoms will appear. These may include:  · Vomiting.  · Confusion and impaired memory.  · Slowed breathing.  · Seizures.  · Loss of consciousness.  DIAGNOSIS   Your health care provider will take a medical history and perform a physical exam. You will be asked about the amount and type of alcohol you have consumed. Blood tests will be done to measure the concentration of alcohol in your blood. In many places, your blood alcohol level must be lower than 80 mg/dL (0.08%) to legally drive. However, many dangerous effects of alcohol can occur at much lower levels.   TREATMENT   People with alcohol intoxication often do not require treatment. Most of the effects of alcohol intoxication are temporary, and they go away as the alcohol naturally  leaves the body. Your health care provider will monitor your condition until you are stable enough to go home. Fluids are sometimes given through an IV access tube to help prevent dehydration.   HOME CARE INSTRUCTIONS  · Do not drive after drinking alcohol.  · Stay hydrated. Drink enough water and fluids to keep your urine clear or pale yellow. Avoid caffeine.    · Only take over-the-counter or prescription medicines as directed by your health care provider.    SEEK MEDICAL CARE IF:   · You have persistent vomiting.    · You do not feel better after a few days.  · You have frequent alcohol intoxication. Your health care provider can help determine if you should see a substance use treatment counselor.  SEEK IMMEDIATE MEDICAL CARE IF:   · You become shaky or tremble when you try to stop drinking.    · You shake uncontrollably (seizure).    · You throw up (vomit) blood. This may be bright red or may look like black coffee grounds.    · You have blood in your stool. This may be bright red or may appear as a black, tarry, bad smelling stool.    · You become lightheaded or faint.    MAKE SURE YOU:   · Understand these instructions.  · Will watch your condition.  · Will get help right away if you are not doing well or get worse.  Document Released: 06/13/2005 Document Revised: 05/06/2013 Document Reviewed: 02/06/2013  [employer]® Patient Information ©2015 [employer]. This information is not intended to replace advice given to you by your health care provider. Make sure   you discuss any questions you have with your health care provider.

## 2014-11-28 NOTE — ED Notes (Signed)
Pt keeps going "unresponsive", (not responding to verbal or painful stimuli).  Pt wakes up immediately to baseline mental status (A&O x 4) when there is any mention of a catheter.  This has happened 3 times.

## 2014-11-28 NOTE — ED Notes (Signed)
Bed: [facility] Expected date:  Expected time:  Means of arrival:  Comments: 

## 2014-11-28 NOTE — ED Notes (Signed)
Pt ripped his own IV out and was wandering up and down the hallway. Advised pt that I needed to check his IV site because he was bleeding and he refused to let me check it saying he was fine. Pt was ambulatory without assistance. MD Audie Pinto made aware, pt given bus pass, and discharged.
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
## 2021-01-30 ENCOUNTER — Emergency Department (HOSPITAL_COMMUNITY): Payer: BLUE CROSS/BLUE SHIELD

## 2021-01-30 ENCOUNTER — Other Ambulatory Visit: Payer: Self-pay

## 2021-01-30 ENCOUNTER — Encounter (HOSPITAL_COMMUNITY): Payer: Self-pay

## 2021-01-30 ENCOUNTER — Emergency Department (HOSPITAL_COMMUNITY)
Admission: EM | Admit: 2021-01-30 | Discharge: 2021-01-30 | Disposition: A | Discharge status: Home / Self Care | Payer: BLUE CROSS/BLUE SHIELD | Attending: Emergency Medicine | Admitting: Emergency Medicine

## 2021-01-30 DIAGNOSIS — H11433 Conjunctival hyperemia, bilateral: Secondary | ICD-10-CM | POA: Insufficient documentation

## 2021-01-30 DIAGNOSIS — R202 Paresthesia of skin: Secondary | ICD-10-CM | POA: Insufficient documentation

## 2021-01-30 DIAGNOSIS — R000 Tachycardia, unspecified: Secondary | ICD-10-CM | POA: Insufficient documentation

## 2021-01-30 DIAGNOSIS — R200 Anesthesia of skin: Secondary | ICD-10-CM

## 2021-01-30 DIAGNOSIS — F17210 Nicotine dependence, cigarettes, uncomplicated: Secondary | ICD-10-CM | POA: Insufficient documentation

## 2021-01-30 LAB — I-STAT CHEM 8, ED
BUN: 26 mg/dL — ABNORMAL HIGH (ref 8–23)
Calcium, Ion: 1.12 mmol/L — ABNORMAL LOW (ref 1.15–1.40)
Chloride: 103 mmol/L (ref 98–111)
Creatinine, Ser: 1.3 mg/dL — ABNORMAL HIGH (ref 0.61–1.24)
Glucose, Bld: 137 mg/dL — ABNORMAL HIGH (ref 70–99)
HCT: 51 % (ref 39.0–52.0)
Hemoglobin: 17.3 g/dL — ABNORMAL HIGH (ref 13.0–17.0)
Potassium: 4.2 mmol/L (ref 3.5–5.1)
Sodium: 135 mmol/L (ref 135–145)
TCO2: 21 mmol/L — ABNORMAL LOW (ref 22–32)

## 2021-01-30 LAB — DIFFERENTIAL
Abs Immature Granulocytes: 0.01 10*3/uL (ref 0.00–0.07)
Basophils Absolute: 0 10*3/uL (ref 0.0–0.1)
Basophils Relative: 1 %
Eosinophils Absolute: 0 10*3/uL (ref 0.0–0.5)
Eosinophils Relative: 0 %
Immature Granulocytes: 0 %
Lymphocytes Relative: 37 %
Lymphs Abs: 1.2 10*3/uL (ref 0.7–4.0)
Monocytes Absolute: 0.5 10*3/uL (ref 0.1–1.0)
Monocytes Relative: 17 %
Neutro Abs: 1.4 10*3/uL — ABNORMAL LOW (ref 1.7–7.7)
Neutrophils Relative %: 45 %

## 2021-01-30 LAB — COMPREHENSIVE METABOLIC PANEL
ALT: 69 U/L — ABNORMAL HIGH (ref 0–44)
AST: 119 U/L — ABNORMAL HIGH (ref 15–41)
Albumin: 4 g/dL (ref 3.5–5.0)
Alkaline Phosphatase: 146 U/L — ABNORMAL HIGH (ref 38–126)
Anion gap: 9 (ref 5–15)
BUN: 24 mg/dL — ABNORMAL HIGH (ref 8–23)
CO2: 23 mmol/L (ref 22–32)
Calcium: 9.2 mg/dL (ref 8.9–10.3)
Chloride: 101 mmol/L (ref 98–111)
Creatinine, Ser: 1.48 mg/dL — ABNORMAL HIGH (ref 0.61–1.24)
GFR, Estimated: 53 mL/min — ABNORMAL LOW (ref >60)
Glucose, Bld: 142 mg/dL — ABNORMAL HIGH (ref 70–99)
Potassium: 4.3 mmol/L (ref 3.5–5.1)
Sodium: 133 mmol/L — ABNORMAL LOW (ref 135–145)
Total Bilirubin: 2.3 mg/dL — ABNORMAL HIGH (ref 0.3–1.2)
Total Protein: 7.5 g/dL (ref 6.5–8.1)

## 2021-01-30 LAB — CBC
HCT: 48.5 % (ref 39.0–52.0)
Hemoglobin: 16.8 g/dL (ref 13.0–17.0)
MCH: 34.8 pg — ABNORMAL HIGH (ref 26.0–34.0)
MCHC: 34.6 g/dL (ref 30.0–36.0)
MCV: 100.4 fL — ABNORMAL HIGH (ref 80.0–100.0)
Platelets: 204 10*3/uL (ref 150–400)
RBC: 4.83 MIL/uL (ref 4.22–5.81)
RDW: 12.5 % (ref 11.5–15.5)
WBC: 3.1 10*3/uL — ABNORMAL LOW (ref 4.0–10.5)
nRBC: 0 % (ref 0.0–0.2)

## 2021-01-30 LAB — CBG MONITORING, ED: Glucose-Capillary: 114 mg/dL — ABNORMAL HIGH (ref 70–99)

## 2021-01-30 LAB — TROPONIN I (HIGH SENSITIVITY)
Troponin I (High Sensitivity): 12 ng/L (ref <18)
Troponin I (High Sensitivity): 13 ng/L (ref <18)

## 2021-01-30 LAB — APTT: aPTT: 34 seconds (ref 24–36)

## 2021-01-30 LAB — PROTIME-INR
INR: 1 (ref 0.8–1.2)
Prothrombin Time: 13.2 seconds (ref 11.4–15.2)

## 2021-01-30 MED ORDER — SODIUM CHLORIDE 0.9 % IV BOLUS
1000.0000 mL | Freq: Once | INTRAVENOUS | Status: AC
  Administered 2021-01-30: 1000 mL via INTRAVENOUS

## 2021-01-30 MED ORDER — GADOBUTROL 1 MMOL/ML IV SOLN
6.0000 mL | Freq: Once | INTRAVENOUS | Status: AC | PRN
  Administered 2021-01-30: 6 mL via INTRAVENOUS

## 2021-01-30 MED ORDER — SODIUM CHLORIDE 0.9% FLUSH: 3.0000 mL | Freq: Once | INTRAVENOUS | Status: DC

## 2021-01-30 NOTE — ED Triage Notes (Signed)
Pt reports he woke this am with Right side numbness. When pt turns his head right it feels normal but feels numbness when he turns his head to the left. Hx of stroke. Denies weakness, blurry vision, no neuro deficits noted in triage.  Pt sats 87% on arrival to ED, placed on 3L [place] 97%. Pt denies feeling SOB. Pt chronic smoker, reports cough but not any different than his daily cough

## 2021-01-30 NOTE — ED Notes (Signed)
Patient SpO2 87% on room air, patient placed on 3L [place], patient sats increased to 97%. Patient denies oxygen supplement at baseline.

## 2021-01-30 NOTE — Discharge Instructions (Addendum)
You are seen in the emergency department today for the altered sensation on the right side of your scalp.  Your physical exam and vital signs are reassuring.  Per neurology recommendations, MRIs were obtained of your brain and neck, which additionally were reassuring.  There are no new changes in your brain or blood vessels that feed your neck and your head.  This is reassuring.  You should follow-up with the neurology practice listed below for reevaluation.    Please continue take the daily baby aspirin as prescribed.  Return to the emergency department if you develop any new numbness, tingling, weakness in your extremities, changes in your vision, or any other new severe symptoms.

## 2021-01-30 NOTE — ED Notes (Signed)
Patient transported to MRI 

## 2021-01-30 NOTE — ED Notes (Signed)
Patient verbalizes understanding of discharge instructions. Opportunity for questioning and answers were provided. Armband removed by staff, pt discharged from ED ambulatory.   

## 2021-01-30 NOTE — ED Provider Notes (Addendum)
Emergency Medicine Provider Triage Evaluation Note  Rodney Buchanan , a 60 y.o. male  was evaluated in triage.  Pt complains of right sided scalp numbness that started approximately 5:00 this morning. Hx of CVA, on baby aspirin daily. Patient states tingling sensation in right scalp with rotation of the head to the left. Also SOB in triage, chronic smoker x 2 packs per day.  Review of Systems  Positive: Scalp numbness (right), SOB Negative: Vision, double vision, weakness, difficulty walking, confusion, difficulty speaking or recent traumas.  Physical Exam  BP 100/81 (BP Location: Right Arm)   Pulse 96   Temp 98.3 F (36.8 C) (Oral)   Resp 16   SpO2 97%  Gen:   Awake, no distress   Resp:  Normal effort, hypoxic in triage now on supplemental oxygen by nasal cannula to maintain O2 >90% MSK:   Moves extremities without difficulty  Other:  Neuro exam without focal deficit, symmetric strength and sensation, CN 2-12 intact (CN 9 not tested)  Medical Decision Making  Medically screening exam initiated at 10:32 AM.  Appropriate orders placed.  Neel Buffone was informed that the remainder of the evaluation will be completed by another provider, this initial triage assessment does not replace that evaluation, and the importance of remaining in the ED until their evaluation is complete.  This chart was dictated using voice recognition software, Dragon. Despite the best efforts of this provider to proofread and correct errors, errors may still occur which can change documentation meaning.    Emeline Darling, PA-C 01/30/21 [address], Gypsy Balsam, PA-C 01/30/21 1036    Lucrezia Starch, MD 01/31/21 [phone]

## 2021-01-30 NOTE — ED Provider Notes (Signed)
[facility] EMERGENCY DEPARTMENT Provider Note   CSN: [id number] Arrival date & time: 01/30/21  3474     History Chief Complaint  Patient presents with  . Numbness    Rodney Buchanan is a 60 y.o. male who presents with concern for numbness and tingling sensation to the right scalp that occurs only when he turns his head to the left.  He states that this started this morning.  He has no associated pain no other numbness, tingling, weakness in his upper extremities.  He has a history of CVA on baby aspirin, without residual complaints.  Denies any headaches, blurry vision or double vision.  Denies any change in his gait.  Of note patient was found to be short of breath in triage, states that he becomes short of breath with long distance walking, patient is also wearing multiple layers and it is quite warm outside.  He is not on any oxygen at home.  I personally reviewed this patient's medical records.  History of alcohol abuse, continues to drink 3 shots and several beers daily.  Smokes 2 packs of cigarettes daily.  HPI     Past Medical History:  Diagnosis Date  . ETOH abuse   . Stroke [facility])    x7    Patient Active Problem List   Diagnosis Date Noted  . 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 TOBACCO ABUSE 04/06/2009  . 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 CVA 04/06/2009    Past Surgical History:  Procedure Laterality Date  . CARDIAC SURGERY     Murmur Repair       History reviewed. No pertinent family history.  Social History   Tobacco Use  . Smoking status: Current Every Day Smoker    Types: Cigarettes  . Smokeless tobacco: Never Used  Substance Use Topics  . Alcohol use: Yes  . Drug use: Yes    Comment: heroin    Home Medications Prior to Admission medications   Medication Sig Start Date End Date Taking? Authorizing Provider  cephALEXin (KEFLEX) 500 MG capsule Take 1 capsule (500 mg total) by mouth 3 (three) times daily. Take all of  medicine and drink lots of fluids Patient not taking: Reported on 11/28/2014 12/23/13   Billy Fischer, MD    Allergies    Patient has no known allergies.  Review of Systems   Review of Systems  Constitutional: Negative.   HENT: Negative.   Respiratory: Positive for shortness of breath.   Cardiovascular: Negative.   Gastrointestinal: Negative.   Musculoskeletal: Negative.   Skin: Negative.   Neurological: Positive for numbness.  Hematological: Negative.     Physical Exam Updated Vital Signs BP (!) 109/91 (BP Location: Right Arm)   Pulse (!) 133   Temp 97.7 F (36.5 C) (Oral)   Resp 20   Ht 5\' 11"  (1.803 m)   Wt 65.8 kg   SpO2 94%   BMI 20.22 kg/m   Physical Exam Vitals and nursing note reviewed.  Constitutional:      Appearance: He is cachectic. He is not ill-appearing or toxic-appearing.  HENT:     Head: Normocephalic and atraumatic.     Nose: Nose normal.     Mouth/Throat:     Mouth: Mucous membranes are moist.     Pharynx: Oropharynx is clear. Uvula midline. No oropharyngeal exudate or posterior oropharyngeal erythema.  Eyes:     General: Lids are normal. Vision grossly intact.        Right eye:  No discharge.        Left eye: No discharge.     Extraocular Movements: Extraocular movements intact.     Conjunctiva/sclera:     Right eye: Right conjunctiva is injected.     Left eye: Left conjunctiva is injected.     Pupils: Pupils are equal, round, and reactive to light.     Visual Fields: Right eye visual fields normal and left eye visual fields normal.  Neck:     Vascular: No carotid bruit or JVD.     Trachea: Trachea and phonation normal.  Cardiovascular:     Rate and Rhythm: Normal rate and regular rhythm.     Pulses: Normal pulses.     Heart sounds: Normal heart sounds. No murmur heard.   Pulmonary:     Effort: Pulmonary effort is normal. No tachypnea, bradypnea, accessory muscle usage, prolonged expiration or respiratory distress.     Breath sounds:  Normal breath sounds. No wheezing or rales.  Chest:     Chest wall: No mass, lacerations, deformity, swelling, tenderness, crepitus or edema.  Abdominal:     General: Bowel sounds are normal. There is no distension.     Palpations: Abdomen is soft. There is no hepatomegaly or splenomegaly.     Tenderness: There is no abdominal tenderness.  Musculoskeletal:        General: No deformity.     Cervical back: Normal range of motion and neck supple. No edema, rigidity or crepitus. No pain with movement, spinous process tenderness or muscular tenderness.     Right lower leg: No edema.     Left lower leg: No edema.  Lymphadenopathy:     Cervical: No cervical adenopathy.  Skin:    General: Skin is warm and dry.  Neurological:     General: No focal deficit present.     Mental Status: He is alert and oriented to person, place, and time. Mental status is at baseline.     Cranial Nerves: Cranial nerves are intact.     Sensory: Sensation is intact.     Motor: Motor function is intact.     Coordination: Coordination is intact.     Gait: Gait is intact.  Psychiatric:        Mood and Affect: Mood normal.     ED Results / Procedures / Treatments   Labs (all labs ordered are listed, but only abnormal results are displayed) Labs Reviewed  CBC - Abnormal; Notable for the following components:      Result Value   WBC 3.1 (*)    MCV 100.4 (*)    MCH 34.8 (*)    All other components within normal limits  DIFFERENTIAL - Abnormal; Notable for the following components:   Neutro Abs 1.4 (*)    All other components within normal limits  COMPREHENSIVE METABOLIC PANEL - Abnormal; Notable for the following components:   Sodium 133 (*)    Glucose, Bld 142 (*)    BUN 24 (*)    Creatinine, Ser 1.48 (*)    AST 119 (*)    ALT 69 (*)    Alkaline Phosphatase 146 (*)    Total Bilirubin 2.3 (*)    GFR, Estimated 53 (*)    All other components within normal limits  I-STAT CHEM 8, ED - Abnormal; Notable  for the following components:   BUN 26 (*)    Creatinine, Ser 1.30 (*)    Glucose, Bld 137 (*)    Calcium, Ion 1.12 (*)  TCO2 21 (*)    Hemoglobin 17.3 (*)    All other components within normal limits  CBG MONITORING, ED - Abnormal; Notable for the following components:   Glucose-Capillary 114 (*)    All other components within normal limits  PROTIME-INR  APTT  ETHANOL  TROPONIN I (HIGH SENSITIVITY)  TROPONIN I (HIGH SENSITIVITY)    EKG EKG Interpretation  Date/Time:  Monday Jan 30 2021 10:11:15 EDT Ventricular Rate:  107 PR Interval:  166 QRS Duration: 76 QT Interval:  344 QTC Calculation: 459 R Axis:   82 Text Interpretation: Sinus tachycardia Right atrial enlargement Cannot rule out Inferior infarct , age undetermined Abnormal ECG Confirmed by Madalyn Rob [id number]) on 01/30/2021 11:45:13 AM   Radiology DG Chest 2 View  Result Date: 01/30/2021 CLINICAL DATA:  Right-sided tingling in the head. Shortness of breath. EXAM: CHEST - 2 VIEW COMPARISON:  03/08/2009. FINDINGS: Trachea is midline. Heart size normal. Lungs are hyperinflated but clear. No pleural fluid. IMPRESSION: Hyperinflation without acute finding. Electronically Signed   By: Lorin Picket M.D.   On: 01/30/2021 11:40   CT Head Wo Contrast  Result Date: 01/30/2021 CLINICAL DATA:  Altered sensation.  Numbness. EXAM: CT HEAD WITHOUT CONTRAST TECHNIQUE: Contiguous axial images were obtained from the base of the skull through the vertex without intravenous contrast. COMPARISON:  September 01, 2011. FINDINGS: Brain: Mild chronic ischemic white matter disease is noted. No mass effect or midline shift is noted. Ventricular size is within normal limits. There is no evidence of mass lesion, hemorrhage or acute infarction. Vascular: No hyperdense vessel or unexpected calcification. Skull: Normal. Negative for fracture or focal lesion. Sinuses/Orbits: No acute finding. Other: None. IMPRESSION: No acute intracranial  abnormality seen. Electronically Signed   By: Marijo Conception M.D.   On: 01/30/2021 13:28   MR ANGIO HEAD WO CONTRAST  Result Date: 01/30/2021 CLINICAL DATA:  Right-sided scalp numbness.  History of stroke. EXAM: MRI HEAD WITHOUT CONTRAST MRA HEAD WITHOUT CONTRAST MRA OF THE NECK WITHOUT AND WITH CONTRAST TECHNIQUE: Multiplanar, multi-echo pulse sequences of the brain and surrounding structures were acquired without intravenous contrast. Angiographic images of the Circle of Willis were acquired using MRA technique without intravenous contrast. Angiographic images of the neck were acquired using MRA technique without and with intravenous contrast. Carotid stenosis measurements (when applicable) are obtained utilizing NASCET criteria, using the distal internal carotid diameter as the denominator. CONTRAST:  85mL GADAVIST GADOBUTROL 1 MMOL/ML IV SOLN COMPARISON:  Head CT 01/30/2021. Head MRI and MRA 03/08/2009. Neck MRA 03/09/2009. FINDINGS: MR HEAD FINDINGS Brain: There is no evidence of an acute infarct, midline shift, or extra-axial fluid collection. Patchy T2 hyperintensities in the cerebral white matter bilaterally have greatly progressed from 2010 and are nonspecific but compatible with moderately extensive chronic small vessel ischemic disease. There is a small chronic infarct involving the left insula. Tiny chronic right cerebellar infarcts are unchanged from the prior MRI. There is a 5 mm lesion with prominent susceptibility artifact medially in the right frontal lobe which was also present on the prior MRI and is consistent with a cavernoma. A 12 x 7 mm focus of dural calcification/ossification versus calcified meningioma projecting leftward from the falx in the frontoparietal region is unchanged without significant mass effect or brain edema. There is mild cerebral atrophy. An expanded, partially empty sella is unchanged from the prior MRI. Vascular: Major intracranial vascular flow voids are preserved.  Skull and upper cervical spine: Unremarkable bone marrow signal. Sinuses/Orbits: Remote medial right  orbital fracture. Paranasal sinuses and mastoid air cells are clear. Other: None. MRA HEAD FINDINGS Anterior circulation: The internal carotid arteries are widely patent from skull base to carotid termini. ACAs and MCAs are patent without evidence of a proximal branch occlusion or significant proximal stenosis. There has been interval recanalization of the occluded left M2 vessel on the 2010 MRA. No aneurysm is identified. Posterior circulation: The intracranial vertebral arteries are widely patent to the basilar. Patent PICA, AICA, and SCA origins are seen bilaterally. The basilar artery is widely patent. Posterior communicating arteries are diminutive or absent. Both PCAs are patent without evidence of a significant proximal stenosis. No aneurysm is identified. Anatomic variants:None. MRA NECK FINDINGS Aortic arch: Standard 3 vessel aortic arch. Widely patent brachiocephalic and subclavian arteries. Right carotid system: Patent without evidence of stenosis or dissection. Left carotid system: Patent without evidence of stenosis or dissection. Vertebral arteries: Patent with antegrade flow bilaterally and no evidence of stenosis or dissection. Codominant. IMPRESSION: 1. No acute intracranial abnormality. 2. Moderately extensive chronic small vessel ischemic disease, greatly progressed from 2010. 3. Negative head MRA. 4. Negative neck MRA. Electronically Signed   By: Logan Bores M.D.   On: 01/30/2021 16:05   MR Angiogram Neck W or Wo Contrast  Result Date: 01/30/2021 CLINICAL DATA:  Right-sided scalp numbness.  History of stroke. EXAM: MRI HEAD WITHOUT CONTRAST MRA HEAD WITHOUT CONTRAST MRA OF THE NECK WITHOUT AND WITH CONTRAST TECHNIQUE: Multiplanar, multi-echo pulse sequences of the brain and surrounding structures were acquired without intravenous contrast. Angiographic images of the Circle of Willis were  acquired using MRA technique without intravenous contrast. Angiographic images of the neck were acquired using MRA technique without and with intravenous contrast. Carotid stenosis measurements (when applicable) are obtained utilizing NASCET criteria, using the distal internal carotid diameter as the denominator. CONTRAST:  47mL GADAVIST GADOBUTROL 1 MMOL/ML IV SOLN COMPARISON:  Head CT 01/30/2021. Head MRI and MRA 03/08/2009. Neck MRA 03/09/2009. FINDINGS: MR HEAD FINDINGS Brain: There is no evidence of an acute infarct, midline shift, or extra-axial fluid collection. Patchy T2 hyperintensities in the cerebral white matter bilaterally have greatly progressed from 2010 and are nonspecific but compatible with moderately extensive chronic small vessel ischemic disease. There is a small chronic infarct involving the left insula. Tiny chronic right cerebellar infarcts are unchanged from the prior MRI. There is a 5 mm lesion with prominent susceptibility artifact medially in the right frontal lobe which was also present on the prior MRI and is consistent with a cavernoma. A 12 x 7 mm focus of dural calcification/ossification versus calcified meningioma projecting leftward from the falx in the frontoparietal region is unchanged without significant mass effect or brain edema. There is mild cerebral atrophy. An expanded, partially empty sella is unchanged from the prior MRI. Vascular: Major intracranial vascular flow voids are preserved. Skull and upper cervical spine: Unremarkable bone marrow signal. Sinuses/Orbits: Remote medial right orbital fracture. Paranasal sinuses and mastoid air cells are clear. Other: None. MRA HEAD FINDINGS Anterior circulation: The internal carotid arteries are widely patent from skull base to carotid termini. ACAs and MCAs are patent without evidence of a proximal branch occlusion or significant proximal stenosis. There has been interval recanalization of the occluded left M2 vessel on the 2010  MRA. No aneurysm is identified. Posterior circulation: The intracranial vertebral arteries are widely patent to the basilar. Patent PICA, AICA, and SCA origins are seen bilaterally. The basilar artery is widely patent. Posterior communicating arteries are diminutive or absent. Both  PCAs are patent without evidence of a significant proximal stenosis. No aneurysm is identified. Anatomic variants:None. MRA NECK FINDINGS Aortic arch: Standard 3 vessel aortic arch. Widely patent brachiocephalic and subclavian arteries. Right carotid system: Patent without evidence of stenosis or dissection. Left carotid system: Patent without evidence of stenosis or dissection. Vertebral arteries: Patent with antegrade flow bilaterally and no evidence of stenosis or dissection. Codominant. IMPRESSION: 1. No acute intracranial abnormality. 2. Moderately extensive chronic small vessel ischemic disease, greatly progressed from 2010. 3. Negative head MRA. 4. Negative neck MRA. Electronically Signed   By: Logan Bores M.D.   On: 01/30/2021 16:05   MR BRAIN WO CONTRAST  Result Date: 01/30/2021 CLINICAL DATA:  Right-sided scalp numbness.  History of stroke. EXAM: MRI HEAD WITHOUT CONTRAST MRA HEAD WITHOUT CONTRAST MRA OF THE NECK WITHOUT AND WITH CONTRAST TECHNIQUE: Multiplanar, multi-echo pulse sequences of the brain and surrounding structures were acquired without intravenous contrast. Angiographic images of the Circle of Willis were acquired using MRA technique without intravenous contrast. Angiographic images of the neck were acquired using MRA technique without and with intravenous contrast. Carotid stenosis measurements (when applicable) are obtained utilizing NASCET criteria, using the distal internal carotid diameter as the denominator. CONTRAST:  76mL GADAVIST GADOBUTROL 1 MMOL/ML IV SOLN COMPARISON:  Head CT 01/30/2021. Head MRI and MRA 03/08/2009. Neck MRA 03/09/2009. FINDINGS: MR HEAD FINDINGS Brain: There is no evidence of an  acute infarct, midline shift, or extra-axial fluid collection. Patchy T2 hyperintensities in the cerebral white matter bilaterally have greatly progressed from 2010 and are nonspecific but compatible with moderately extensive chronic small vessel ischemic disease. There is a small chronic infarct involving the left insula. Tiny chronic right cerebellar infarcts are unchanged from the prior MRI. There is a 5 mm lesion with prominent susceptibility artifact medially in the right frontal lobe which was also present on the prior MRI and is consistent with a cavernoma. A 12 x 7 mm focus of dural calcification/ossification versus calcified meningioma projecting leftward from the falx in the frontoparietal region is unchanged without significant mass effect or brain edema. There is mild cerebral atrophy. An expanded, partially empty sella is unchanged from the prior MRI. Vascular: Major intracranial vascular flow voids are preserved. Skull and upper cervical spine: Unremarkable bone marrow signal. Sinuses/Orbits: Remote medial right orbital fracture. Paranasal sinuses and mastoid air cells are clear. Other: None. MRA HEAD FINDINGS Anterior circulation: The internal carotid arteries are widely patent from skull base to carotid termini. ACAs and MCAs are patent without evidence of a proximal branch occlusion or significant proximal stenosis. There has been interval recanalization of the occluded left M2 vessel on the 2010 MRA. No aneurysm is identified. Posterior circulation: The intracranial vertebral arteries are widely patent to the basilar. Patent PICA, AICA, and SCA origins are seen bilaterally. The basilar artery is widely patent. Posterior communicating arteries are diminutive or absent. Both PCAs are patent without evidence of a significant proximal stenosis. No aneurysm is identified. Anatomic variants:None. MRA NECK FINDINGS Aortic arch: Standard 3 vessel aortic arch. Widely patent brachiocephalic and subclavian  arteries. Right carotid system: Patent without evidence of stenosis or dissection. Left carotid system: Patent without evidence of stenosis or dissection. Vertebral arteries: Patent with antegrade flow bilaterally and no evidence of stenosis or dissection. Codominant. IMPRESSION: 1. No acute intracranial abnormality. 2. Moderately extensive chronic small vessel ischemic disease, greatly progressed from 2010. 3. Negative head MRA. 4. Negative neck MRA. Electronically Signed   By: Seymour Bars.D.  On: 01/30/2021 16:05    Procedures Procedures   Medications Ordered in ED Medications  sodium chloride flush (NS) 0.9 % injection 3 mL (3 mLs Intravenous Not Given 01/30/21 1406)  sodium chloride 0.9 % bolus 1,000 mL (0 mLs Intravenous Stopped 01/30/21 1429)  gadobutrol (GADAVIST) 1 MMOL/ML injection 6 mL (6 mLs Intravenous Contrast Given 01/30/21 1527)    ED Course  I have reviewed the triage vital signs and the nursing notes.  Pertinent labs & imaging results that were available during my care of the patient were reviewed by me and considered in my medical decision making (see chart for details).  Clinical Course as of 01/30/21 1856  Mon Jan 30, 2021  1321 Consult to neurologist, Dr. Rory Percy, who reviewed the patient's records and recommends MRI of the brain, MRA of the head without contrast, and MRA of the neck with contrast for further evaluation.  Given reassuring exam, if imaging is normal, patient may be discharged home per neurology recommendation. I appreciate his collaboration in the care of this patient.  [RS]    Clinical Course User Index [RS] Kamee Bobst, Sharlene Dory   MDM Rules/Calculators/A&P                         60 year old male with history of CVA who presents with concern for right-sided numbness and tingling rotation of the head to the left.  On daily aspirin.  Differential diagnosis includes is not limited to CVA transient sedation efficiency secondary to stenosis versus  dissection.  Transiently hypoxic to 87% on RA, improved with [employer] O2. Now off of O2, with maintenance of sats on RA. Cardiopulmonary exam normal, abdominal exam is benign. No deficit on neurologic exam.   CBC with leukopenia 3.1, CMP with AKI with BUN/creatinine 24/1.48 .  Will administer IV fluid resuscitation.  Additionally patient has transaminitis and elevated bilirubin to to 1 ratio of AST/ALT consistent with patient's history of alcohol abuse.  No abdominal tenderness to palpation or abdominal symptoms that would warrant further investigation.  Troponin is negative, 12, delta troponin is negative, EKG is tachycardia, chest x-ray negative for acute cardiopulmonary disease.  CT of the head was obtained which was negative for acute cranial abnormality.  Per neurology recommendations MRI of the brain, MRA of the head and MRA with contrast of the neck were obtained given patient's history.  The studies revealed moderately extensive chronic small vessel ischemic disease greatly progressed from 2010, but otherwise no acute cranial abnormality.  Negative head MRA are negative neck MRA.  Given normal neurologic exam and reassuring vital signs, blood work, and imaging studies, no further work-up is warranted in ED at this time. Suspect symptoms are secondary to acute muscle spasm rather than cerebral event.  Recommend close outpatient neurologic follow-up.   Dontrell voiced understanding of his medical evaluation and treatment plan.  Each of his questions was answered to his expressed satisfaction.  Return precautions are given.  Patient is well-appearing, stable, and appropriate for discharge at this time.  This chart was dictated using voice recognition software, Dragon. Despite the best efforts of this provider to proofread and correct errors, errors may still occur which can change documentation meaning.  Final Clinical Impression(s) / ED Diagnoses Final diagnoses:  Numbness    Rx / DC Orders ED  Discharge Orders    None       Aura Dials 01/30/21 1856    Lucrezia Starch, MD 01/31/21 1131

## 2021-04-18 ENCOUNTER — Ambulatory Visit (HOSPITAL_COMMUNITY)
Admission: EM | Admit: 2021-04-18 | Discharge: 2021-04-18 | Disposition: A | Discharge status: Home / Self Care | Payer: Self-pay | Attending: Emergency Medicine | Admitting: Emergency Medicine

## 2021-04-18 ENCOUNTER — Other Ambulatory Visit: Payer: Self-pay

## 2021-04-18 ENCOUNTER — Encounter (HOSPITAL_COMMUNITY): Payer: Self-pay | Admitting: Emergency Medicine

## 2021-04-18 DIAGNOSIS — D171 Benign lipomatous neoplasm of skin and subcutaneous tissue of trunk: Secondary | ICD-10-CM

## 2021-04-18 DIAGNOSIS — K458 Other specified abdominal hernia without obstruction or gangrene: Secondary | ICD-10-CM

## 2021-04-18 NOTE — Discharge Instructions (Addendum)
Follow-up with [place] [facility] as soon as you possibly can for elective repair of the hernia.  Go to the ER if it becomes painful, if you cannot push it back in, for nausea, vomiting, fevers, or any other concerns  Below is a list of primary care practices who are taking new patients for you to follow-up with.  [facility] internal medicine clinic Ground Floor - [facility], [place], [place], [place] [address] [phone]  [facility] Primary Care at [facility] [address] [place], [place] [address] [phone]  [place] and [facility] [place], [place] [address] [phone]  [facility] [phone] [place] [address]  [facility]: [place]  [phone]  [facility] Family Medicine: [address] [place]  [phone]  [place] primary care : [address] [place] [phone]  [facility] Primary Care: [address] [place] [phone]  [facility]: [facility] [phone]  Dr. Blanchie Serve [place] [address]  [phone]  Go to www.goodrx.com  or www.costplusdrugs.com to look up your medications. This will give you a list of where you can find your prescriptions at the most affordable prices. Or ask the pharmacist what the cash price is, or if they have any other discount programs available to help make your medication more affordable. This can be less expensive than what you would pay with insurance.

## 2021-04-18 NOTE — ED Triage Notes (Signed)
Patient reports "bump" in groin area and reports another "bump " on right flank patient reports they come and go.  Areas are not painful.  Patient has had these "bumps" for 3 weeks or more.

## 2021-04-18 NOTE — ED Provider Notes (Signed)
HPI  SUBJECTIVE:  Rodney Buchanan is a 60 y.o. male who presents with a painless "bump" in his left inguinal region/left lower quadrant starting 3 to 4 months ago.  He states that it got slightly bigger 2 days ago.  No nausea, vomiting, fevers, testicular, scrotal pain or swelling, abdominal pain, urinary complaints, penile rash or discharge.  No aggravating or alleviating factors.  He has not tried anything for this.  He also reports having a painless mass over his right hip for the past 6 months.  No overlying erythema.  He has a past medical history of stroke, coronary artery disease, status post CABG.  No history of abdominal surgeries.  PMD: None.   Past Medical History:  Diagnosis Date   ETOH abuse    Stroke ([place])    x7    Past Surgical History:  Procedure Laterality Date   CARDIAC SURGERY     Murmur Repair    Family History  Problem Relation Age of Onset   Stroke Mother     Social History   Tobacco Use   Smoking status: Every Day    Types: Cigarettes   Smokeless tobacco: Never  Vaping Use   Vaping Use: Never used  Substance Use Topics   Alcohol use: Yes   Drug use: Not Currently    Comment: heroin    No current facility-administered medications for this encounter.  Current Outpatient Medications:    aspirin EC 81 MG tablet, Take 81 mg by mouth daily. Swallow whole., Disp: , Rfl:   No Known Allergies   ROS  As noted in HPI.   Physical Exam  BP 120/72 (BP Location: Right Arm) Comment (BP Location): difficult to get reading  Pulse 83   Temp 98.7 F (37.1 C) (Oral)   Resp 18   SpO2 94%   Constitutional: Well developed, well nourished, no acute distress Eyes:  EOMI, conjunctiva normal bilaterally HENT: Normocephalic, atraumatic,mucus membranes moist Respiratory: Normal inspiratory effort Cardiovascular: Normal rate GI: nondistended.  Soft.  Positive nontender reducible hernia superior to the left inguinal region.  Positive bulging with  Valsalva.  GU: Normal uncircumcised male, testes descended bilaterally.  No testicular, epididymal tenderness.  No scrotal swelling, tenderness. skin: Nontender, soft, nonerythematous mass at the right hip consistent with a lipoma   Musculoskeletal: no deformities Neurologic: Alert & oriented x 3, no focal neuro deficits Psychiatric: Speech and behavior appropriate   ED Course   Medications - No data to display  No orders of the defined types were placed in this encounter.   No results found for this or any previous visit (from the past 24 hour(s)). No results found.  ED Clinical Impression  1. Other specified abdominal hernia without obstruction or gangrene   2. Lipoma of torso      ED Assessment/Plan  1.  Abdominal hernia.  It is nontender, and is reducible.  Will refer to [place] [facility] for elective repair.  We will also provide primary care list for ongoing care and order assistance in finding a PMD.  ER return precautions given.  2.  Soft tissue mass of her right hip, appears to be lipoma.  Discussed with patient that there is nothing to do for this unless it becomes painful or bothersome.  Discussed MDM, treatment plan, and plan for follow-up with patient. Discussed sn/sx that should prompt return to the ED. patient agrees with plan.   No orders of the defined types were placed in this encounter.     *This  clinic note was created using [employer]. Therefore, there may be occasional mistakes despite careful proofreading.  ?    Melynda Ripple, MD 04/21/21 [phone]

## 2021-12-29 ENCOUNTER — Emergency Department (HOSPITAL_COMMUNITY)
Admission: EM | Admit: 2021-12-29 | Discharge: 2021-12-30 | Disposition: A | Discharge status: Home / Self Care | Payer: Commercial Managed Care - HMO | Attending: Emergency Medicine | Admitting: Emergency Medicine

## 2021-12-29 ENCOUNTER — Other Ambulatory Visit: Payer: Self-pay

## 2021-12-29 ENCOUNTER — Emergency Department (HOSPITAL_COMMUNITY): Payer: Commercial Managed Care - HMO

## 2021-12-29 DIAGNOSIS — Z7982 Long term (current) use of aspirin: Secondary | ICD-10-CM | POA: Insufficient documentation

## 2021-12-29 DIAGNOSIS — F10129 Alcohol abuse with intoxication, unspecified: Secondary | ICD-10-CM | POA: Insufficient documentation

## 2021-12-29 DIAGNOSIS — R569 Unspecified convulsions: Secondary | ICD-10-CM | POA: Diagnosis present

## 2021-12-29 DIAGNOSIS — F10929 Alcohol use, unspecified with intoxication, unspecified: Secondary | ICD-10-CM

## 2021-12-29 LAB — ETHANOL: Alcohol, Ethyl (B): 370 mg/dL (ref <10)

## 2021-12-29 LAB — CBC WITH DIFFERENTIAL/PLATELET
Abs Immature Granulocytes: 0.01 10*3/uL (ref 0.00–0.07)
Basophils Absolute: 0 10*3/uL (ref 0.0–0.1)
Basophils Relative: 1 %
Eosinophils Absolute: 0.2 10*3/uL (ref 0.0–0.5)
Eosinophils Relative: 5 %
HCT: 47.3 % (ref 39.0–52.0)
Hemoglobin: 15.8 g/dL (ref 13.0–17.0)
Immature Granulocytes: 0 %
Lymphocytes Relative: 43 %
Lymphs Abs: 1.6 10*3/uL (ref 0.7–4.0)
MCH: 34.8 pg — ABNORMAL HIGH (ref 26.0–34.0)
MCHC: 33.4 g/dL (ref 30.0–36.0)
MCV: 104.2 fL — ABNORMAL HIGH (ref 80.0–100.0)
Monocytes Absolute: 0.3 10*3/uL (ref 0.1–1.0)
Monocytes Relative: 7 %
Neutro Abs: 1.7 10*3/uL (ref 1.7–7.7)
Neutrophils Relative %: 44 %
Platelets: 180 10*3/uL (ref 150–400)
RBC: 4.54 MIL/uL (ref 4.22–5.81)
RDW: 13.5 % (ref 11.5–15.5)
WBC: 3.9 10*3/uL — ABNORMAL LOW (ref 4.0–10.5)
nRBC: 0 % (ref 0.0–0.2)

## 2021-12-29 LAB — COMPREHENSIVE METABOLIC PANEL
ALT: 43 U/L (ref 0–44)
AST: 76 U/L — ABNORMAL HIGH (ref 15–41)
Albumin: 4.2 g/dL (ref 3.5–5.0)
Alkaline Phosphatase: 95 U/L (ref 38–126)
Anion gap: 13 (ref 5–15)
BUN: 10 mg/dL (ref 8–23)
CO2: 22 mmol/L (ref 22–32)
Calcium: 8.6 mg/dL — ABNORMAL LOW (ref 8.9–10.3)
Chloride: 104 mmol/L (ref 98–111)
Creatinine, Ser: 1.16 mg/dL (ref 0.61–1.24)
GFR, Estimated: >60 mL/min (ref >60)
Glucose, Bld: 79 mg/dL (ref 70–99)
Potassium: 5.4 mmol/L — ABNORMAL HIGH (ref 3.5–5.1)
Sodium: 139 mmol/L (ref 135–145)
Total Bilirubin: 1.3 mg/dL — ABNORMAL HIGH (ref 0.3–1.2)
Total Protein: 6.7 g/dL (ref 6.5–8.1)

## 2021-12-29 LAB — RAPID URINE DRUG SCREEN, HOSP PERFORMED
Amphetamines: NOT DETECTED
Barbiturates: NOT DETECTED
Benzodiazepines: POSITIVE — AB
Cocaine: NOT DETECTED
Opiates: NOT DETECTED
Tetrahydrocannabinol: NOT DETECTED

## 2021-12-29 MED ORDER — SUCCINYLCHOLINE CHLORIDE 200 MG/10ML IV SOSY
PREFILLED_SYRINGE | INTRAVENOUS | Status: AC
  Filled 2021-12-29: qty 10

## 2021-12-29 MED ORDER — ETOMIDATE 2 MG/ML IV SOLN
INTRAVENOUS | Status: AC
  Filled 2021-12-29: qty 20

## 2021-12-29 MED ORDER — LORAZEPAM 2 MG/ML IJ SOLN
INTRAMUSCULAR | Status: AC
  Administered 2021-12-29: 1 mg
  Filled 2021-12-29: qty 1

## 2021-12-29 MED ORDER — ROCURONIUM BROMIDE 10 MG/ML (PF) SYRINGE
PREFILLED_SYRINGE | INTRAVENOUS | Status: AC
  Filled 2021-12-29: qty 10

## 2021-12-29 NOTE — ED Triage Notes (Signed)
Pt refusing to answer many questions at this time due to agitation and intoxication, unable to complete all of triage at this time ?

## 2021-12-29 NOTE — ED Notes (Signed)
Pt was attempting to get out of bed, nurse tech was assisting pt to use urinal rather than getting out of bed, pt was agitated and yelling then pt went unresponsive, at this time nurse tech pulled me from nearby room, notified MD and RT, pt had a pulse at all times but breathing became shallow, at this time we moved pt from hallway into a room and began bagging pt, MD gave verbal order for ativan with concerns for pt seizing, 1 mg of ativan was given and pt began responding to painful stimuli and then became fulling alert ?

## 2021-12-29 NOTE — ED Provider Notes (Addendum)
?[facility] ?Provider Note ? ? ?CSN: [id number] ?Arrival date & time: 12/29/21  2017 ? ?  ? ?History ? ?Chief Complaint  ?Patient presents with  ? Seizures  ? Alcohol Intoxication  ? ? ?Rodney Buchanan is a 61 y.o. male. ? ?Pt is reported to have seizures when he drinks a lot.  Pt had 2 seizures tonight  ? ?The history is provided by the patient. No language interpreter was used.  ?Seizures ?Seizure activity on arrival: no   ?Seizure type:  Unable to specify ?Initial focality:  None ?Severity:  Moderate ?Progression:  Worsening ?Recent head injury:  No recent head injuries ?PTA treatment:  Midazolam ?History of seizures: yes   ?Current therapy:  None ?Alcohol Intoxication ? ? ?  ? ?Home Medications ?Prior to Admission medications   ?Medication Sig Start Date End Date Taking? Authorizing Provider  ?aspirin EC 81 MG tablet Take 81 mg by mouth daily. Swallow whole.    [provider]  ?   ? ?Allergies    ?Patient has no known allergies.   ? ?Review of Systems   ?Review of Systems  ?Unable to perform ROS: Mental status change  ?Neurological:  Positive for seizures.  ?All other systems reviewed and are negative. ? ?Physical Exam ?Updated Vital Signs ?BP 117/67   Pulse 72   Resp 16   SpO2 94%  ?Physical Exam ?Vitals and nursing note reviewed.  ?Constitutional:   ?   General: He is not in acute distress. ?   Appearance: He is well-developed.  ?   Comments: sleeping  ?HENT:  ?   Head: Normocephalic and atraumatic.  ?Eyes:  ?   Conjunctiva/sclera: Conjunctivae normal.  ?Cardiovascular:  ?   Rate and Rhythm: Normal rate and regular rhythm.  ?   Heart sounds: No murmur heard. ?Pulmonary:  ?   Effort: Pulmonary effort is normal. No respiratory distress.  ?   Breath sounds: Normal breath sounds.  ?Abdominal:  ?   Palpations: Abdomen is soft.  ?   Tenderness: There is no abdominal tenderness.  ?Musculoskeletal:     ?   General: No swelling. Normal range of motion.  ?   Cervical  back: Neck supple.  ?Skin: ?   General: Skin is warm and dry.  ?   Capillary Refill: Capillary refill takes less than 2 seconds.  ?Psychiatric:  ?   Comments: sedated  ? ? ?ED Results / Procedures / Treatments   ?Labs ?(all labs ordered are listed, but only abnormal results are displayed) ?Labs Reviewed  ?CBC WITH DIFFERENTIAL/PLATELET  ?COMPREHENSIVE METABOLIC PANEL  ?ETHANOL  ?RAPID URINE DRUG SCREEN, HOSP PERFORMED  ? ? ?EKG ?None ? ?Radiology ?No results found. ? ?Procedures ?[facility] KitchenCritical Care ?Performed by: Fransico Meadow, PA-C ?Authorized by: Fransico Meadow, PA-C  ? ?Critical care provider statement:  ?  Critical care time (minutes):  30 ?  Critical care start time:  12/29/2021 9:30 AM ?  Critical care end time:  12/29/2021 11:00 PM ?  Critical care was necessary to treat or prevent imminent or life-threatening deterioration of the following conditions:  CNS failure or compromise and respiratory failure ?  Critical care was time spent personally by me on the following activities:  Development of treatment plan with patient or surrogate, discussions with consultants, evaluation of patient's response to treatment, obtaining history from patient or surrogate, pulse oximetry, re-evaluation of patient's condition, ordering and review of radiographic studies, ordering and review of laboratory studies and  ordering and performing treatments and interventions  ? ? ?Medications Ordered in ED ?Medications - No data to display ? ?ED Course/ Medical Decision Making/ A&P ?  ?                        ?Medical Decision Making ?Pt is reported to have been drinking with friends.  EMs was called due to pt having a seizure.  EMs witnessed a seizure and gave versed.   ? ?Amount and/or Complexity of Data Reviewed ?Independent Historian: spouse ?   Details: Pt;s wife reports pt has a history of alcohol abuse.  She states pt has not had seizures in the past ?External Data Reviewed: radiology. ?   Details: Previous MRI reviewed ?Labs:  ordered. Decision-making details documented in ED Course. ?   Details: Labs ordered reviewed and interpreted alcohol level is 370 ?Radiology: ordered and independent interpretation performed. Decision-making details documented in ED Course. ?   Details: CT scan no hemorrhage no acute evidence of acute CVA ?Discussion of management or test interpretation with external provider(s): I discussed the patient with Dr. Leonel Ramsay neurology who saw and evaluated the patient. ? ? ?MDM:  Labs ordered,  pt on monitor. EMS gave pt versed before arrival.  Pt reported to have had a unresponsive episode .  Pt became stiff and required ventilation with bag.  Dr. Roslynn Amble at bedside.   ?Pt's care turned over to Dr. Leonette Monarch at 11:45 pm.  Pending neurology evaltuion.   ? ? ? ? ? ? ? ?Final Clinical Impression(s) / ED Diagnoses ?Final diagnoses:  ?Alcoholic intoxication with complication ([place])  ?Seizure ([place])  ? ? ?Rx / DC Orders ?ED Discharge Orders   ? ? None  ? ?  ? ? ?  ?Fransico Meadow, [place] ?12/29/21 2338 ? ?  ?Fransico Meadow, [place] ?12/30/21 0002 ? ?  ?Lucrezia Starch, MD ?12/31/21 1712 ? ?

## 2021-12-29 NOTE — ED Triage Notes (Signed)
Pt arrived via EMS following a seizure, upon EMS arrival pt was in a shed intoxicated, EMS witnessed a 2nd seizure that lasted about 30-45seconds, EMS gave '5mg'$  of versed IM en route. Per EMS pts wife stated he had a similar episode last year when he was drinking ?

## 2021-12-29 NOTE — ED Notes (Signed)
Seizure pads applied to bed.  

## 2021-12-30 ENCOUNTER — Emergency Department (HOSPITAL_COMMUNITY): Payer: Commercial Managed Care - HMO

## 2021-12-30 DIAGNOSIS — R569 Unspecified convulsions: Secondary | ICD-10-CM

## 2021-12-30 LAB — MAGNESIUM: Magnesium: 2 mg/dL (ref 1.7–2.4)

## 2021-12-30 LAB — LACTIC ACID, PLASMA
Lactic Acid, Venous: 3.2 mmol/L (ref 0.5–1.9)
Lactic Acid, Venous: 2.6 mmol/L (ref 0.5–1.9)

## 2021-12-30 LAB — TROPONIN I (HIGH SENSITIVITY)
Troponin I (High Sensitivity): 22 ng/L — ABNORMAL HIGH (ref <18)
Troponin I (High Sensitivity): 25 ng/L — ABNORMAL HIGH (ref <18)

## 2021-12-30 LAB — CK: Total CK: 313 U/L (ref 49–397)

## 2021-12-30 MED ORDER — THIAMINE HCL 100 MG/ML IJ SOLN
100.0000 mg | Freq: Once | INTRAMUSCULAR | Status: AC
  Administered 2021-12-30: 100 mg via INTRAVENOUS
  Filled 2021-12-30: qty 2

## 2021-12-30 MED ORDER — SODIUM CHLORIDE 0.9 % IV BOLUS
1000.0000 mL | Freq: Once | INTRAVENOUS | Status: AC
  Administered 2021-12-30: 1000 mL via INTRAVENOUS

## 2021-12-30 MED ORDER — SODIUM CHLORIDE 0.9 % IV BOLUS (SEPSIS)
1000.0000 mL | Freq: Once | INTRAVENOUS | Status: AC
  Administered 2021-12-30: 1000 mL via INTRAVENOUS

## 2021-12-30 MED ORDER — LEVETIRACETAM IN NACL 1000 MG/100ML IV SOLN
1000.0000 mg | Freq: Once | INTRAVENOUS | Status: AC
  Administered 2021-12-30: 1000 mg via INTRAVENOUS
  Filled 2021-12-30: qty 100

## 2021-12-30 MED ORDER — SODIUM CHLORIDE 0.9 % IV SOLN
1000.0000 mL | INTRAVENOUS | Status: DC
  Administered 2021-12-30 (×2): 1000 mL via INTRAVENOUS

## 2021-12-30 NOTE — Procedures (Signed)
Patient Name: Rodney Buchanan  ?MRN: [id number]  ?Epilepsy Attending: Lora Havens  ?Referring Physician/Provider: Greta Doom, MD ?Date: 12/30/2021 ?Duration: 23.27 mins ? ?Patient history: 61 year old male with concern for seizure activity. EEG to evaluate for seizure ? ?Level of alertness: Awake, asleep ? ?AEDs during EEG study: LEV ? ?Technical aspects: This EEG study was done with scalp electrodes positioned according to the 10-20 International system of electrode placement. Electrical activity was acquired at a sampling rate of '500Hz'$  and reviewed with a high frequency filter of '70Hz'$  and a low frequency filter of '1Hz'$ . EEG data were recorded continuously and digitally stored.  ? ?Description: The posterior dominant rhythm consists of 8 Hz activity of moderate voltage (25-35 uV) seen predominantly in posterior head regions, symmetric and reactive to eye opening and eye closing. Sleep was characterized by vertex waves, sleep spindles (12 to 14 Hz), maximal frontocentral region. Physiologic photic driving was not seen during photic stimulation.  Hyperventilation was not performed.    ? ?IMPRESSION: ?This study is within normal limits. No seizures or epileptiform discharges were seen throughout the recording. ? ?Lora Havens  ? ?

## 2021-12-30 NOTE — Progress Notes (Signed)
Neurology Progress Note ? ?Brief HPI:  ? Per chart review :Rodney Buchanan is a 61 y.o. male with a history of alcohol abuse and stroke who presents with concern for seizure activity.  He was drinking and then had an episode concerning for seizure.  He was then witnessed to have a second seizure by EMS.  He was brought into the emergency department where he had an episode of unresponsiveness without shaking.  He was not monitored at the time given that he had ripped his leads off.  Following that he was given Ativan, and has been drowsy but arousable since that time.  ? He may have a history of seizures associated with drinking heavily in the past. ? ?Interval History ?Patient seen and examined this am during rounds, had his eye closed but upon noxious stimulation was widely awoke. Able to answer questions appropriately. Denies prior history of seizure which was confirmed by his wife via phone. States " they said I had seizure while I was drinking". ?Patient was encouraged to decrease/ no drinking at all to avoid seizure-like activity. ?Called and spoke with his wife Rodney Buchanan ) over the phone, updated her on result of Routine EEG, patient seek help in alcohol abuse, If patient continues to come to the hospital with seizure -like activity, he may end up on  seizure mediation. No driving was emphasized. She verbalized understanding  ? ? ?Exam: ?Vitals:  ? 12/30/21 0900 12/30/21 1225  ?BP: 118/66 113/74  ?Pulse: 100 (!) 113  ?Resp: 17 19  ?Temp:    ?SpO2: 91% 94%  ? ? ?General Physical Examination ? ?General:  No acute distress ?HEENT: Normocephalic, Atraumatic ?Neck:  Supple  ?Lungs:  No respiratory distress ?Abdomen:  Non-tender, Non-distended  ?Skin:  Warm and dry    ?Extremities:  Full ROM  ?Psychiatry: Appropriate mood and affect  ? ?Neuro: ?Brief Neurologic Examination ? ?Mental Status:  ?Alert ?Oriented to person, place, and time ?Follows 2 step commands ?Attention and concentration are normal ?Speech is  normal; fluent  ? ?Cranial Nerves:  ?EOMI; PEERL ?Facial expression is full and symmetric ?Facial sensation intact to soft touch ?Hearing grossly intact ?Tongue protrudes midline ?  ?Motor Exam:  ?5/5x4 ?Normal bulk and tone ?No abnormal movements observed  ?  ?Sensory Exam:  ?Intact to light touch in all extremities ? ?Coordination:  ?Finger to nose normal  ? ?Reflexes:  ?Plantar response- Left and Right downgoing  ? ?Gait:  ?Deferred   ? ? ?Medications:  ? ?Review of Systems: ?Except for pertinent positives listed in the HPI, all other systems were reviewed and are negative ? ?Physical Examination ?Temp:  [97.5 ?F (36.4 ?C)] 97.5 ?F (36.4 ?C) (04/15 9518) ?Pulse Rate:  [50-113] 113 (04/15 1225) ?Resp:  [10-20] 19 (04/15 1225) ?BP: (90-128)/(57-109) 113/74 (04/15 1225) ?SpO2:  [84 %-100 %] 94 % (04/15 1225) Pain Score: Asleep ? ? ?Results ?Labs:  ?Results for orders placed or performed during the hospital encounter of 12/29/21 (from the past 24 hour(s))  ?CBC with Differential  ? Collection Time: 12/29/21  8:55 PM  ?Result Value Ref Range  ? WBC 3.9 (L) 4.0 - 10.5 K/uL  ? RBC 4.54 4.22 - 5.81 MIL/uL  ? Hemoglobin 15.8 13.0 - 17.0 g/dL  ? HCT 47.3 39.0 - 52.0 %  ? MCV 104.2 (H) 80.0 - 100.0 fL  ? MCH 34.8 (H) 26.0 - 34.0 pg  ? MCHC 33.4 30.0 - 36.0 g/dL  ? RDW 13.5 11.5 - 15.5 %  ?  Platelets 180 150 - 400 K/uL  ? nRBC 0.0 0.0 - 0.2 %  ? Neutrophils Relative % 44 %  ? Neutro Abs 1.7 1.7 - 7.7 K/uL  ? Lymphocytes Relative 43 %  ? Lymphs Abs 1.6 0.7 - 4.0 K/uL  ? Monocytes Relative 7 %  ? Monocytes Absolute 0.3 0.1 - 1.0 K/uL  ? Eosinophils Relative 5 %  ? Eosinophils Absolute 0.2 0.0 - 0.5 K/uL  ? Basophils Relative 1 %  ? Basophils Absolute 0.0 0.0 - 0.1 K/uL  ? Immature Granulocytes 0 %  ? Abs Immature Granulocytes 0.01 0.00 - 0.07 K/uL  ?Comprehensive metabolic panel  ? Collection Time: 12/29/21  8:55 PM  ?Result Value Ref Range  ? Sodium 139 135 - 145 mmol/L  ? Potassium 5.4 (H) 3.5 - 5.1 mmol/L  ? Chloride 104 98  - 111 mmol/L  ? CO2 22 22 - 32 mmol/L  ? Glucose, Bld 79 70 - 99 mg/dL  ? BUN 10 8 - 23 mg/dL  ? Creatinine, Ser 1.16 0.61 - 1.24 mg/dL  ? Calcium 8.6 (L) 8.9 - 10.3 mg/dL  ? Total Protein 6.7 6.5 - 8.1 g/dL  ? Albumin 4.2 3.5 - 5.0 g/dL  ? AST 76 (H) 15 - 41 U/L  ? ALT 43 0 - 44 U/L  ? Alkaline Phosphatase 95 38 - 126 U/L  ? Total Bilirubin 1.3 (H) 0.3 - 1.2 mg/dL  ? GFR, Estimated >60 >60 mL/min  ? Anion gap 13 5 - 15  ?Ethanol  ? Collection Time: 12/29/21  8:55 PM  ?Result Value Ref Range  ? Alcohol, Ethyl (B) 370 (HH) <10 mg/dL  ?Rapid urine drug screen (hospital performed)  ? Collection Time: 12/29/21  8:55 PM  ?Result Value Ref Range  ? Opiates NONE DETECTED NONE DETECTED  ? Cocaine NONE DETECTED NONE DETECTED  ? Benzodiazepines POSITIVE (A) NONE DETECTED  ? Amphetamines NONE DETECTED NONE DETECTED  ? Tetrahydrocannabinol NONE DETECTED NONE DETECTED  ? Barbiturates NONE DETECTED NONE DETECTED  ?Magnesium  ? Collection Time: 12/30/21 12:21 AM  ?Result Value Ref Range  ? Magnesium 2.0 1.7 - 2.4 mg/dL  ?CK  ? Collection Time: 12/30/21 12:21 AM  ?Result Value Ref Range  ? Total CK 313 49 - 397 U/L  ?Lactic acid, plasma  ? Collection Time: 12/30/21 12:21 AM  ?Result Value Ref Range  ? Lactic Acid, Venous 3.2 (HH) 0.5 - 1.9 mmol/L  ?Troponin I (High Sensitivity)  ? Collection Time: 12/30/21 12:21 AM  ?Result Value Ref Range  ? Troponin I (High Sensitivity) 22 (H) <18 ng/L  ?Troponin I (High Sensitivity)  ? Collection Time: 12/30/21  2:35 AM  ?Result Value Ref Range  ? Troponin I (High Sensitivity) 25 (H) <18 ng/L  ?Lactic acid, plasma  ? Collection Time: 12/30/21  5:36 AM  ?Result Value Ref Range  ? Lactic Acid, Venous 2.6 (HH) 0.5 - 1.9 mmol/L  ? ? Labs reviewed  in epic and the results ?EtOH 370 ?UDS positive for benzodiazepines(he received Versed with EMS) ? ?Imaging Reviewed: ?CXR   done on 12/30/2021 shows Mild cardiomegaly with no acute process. ? ? Head CT WO contrast shows no acute intracranial process and  Extensive chronic microvascular ischemic changes. ? ?Routine EEG  demonstrates  This study is within normal limits. No seizures or epileptiform discharges were seen throughout the recording. ? ? ?Impression:  ?: 61 year old male with concern for seizure activity.  I was unable to get a hold of anybody  to get a description of the episode at home, but the episode in the ED here had no convulsive activity.  The etiology of that event is unclear. Received a dose of Keppra '1000mg'$  IV x 1.  Binge drinking associated seizure is possible.  ? ? ?Recommendations: ?1) Since no history of seizure and rEEG is read as normal, patient does not need to be discharge on any AED. ? ?2. If patient continues to be admitted for seizure-like activity in the setting of alcohol abuse, he will then be put on AED. ? ?3. Encourage substance abuse counseling  ? ?4. Patient and his wife via phone was educated on no driving and seizure precautions  ? ? ?Patient  seen by NP/Neuro and later by MD. Note/plan to be edited by MD as needed.  ?Joelyn Oms, NP-BC ?12/30/2021 / 1:32 PM ? ?[facility] ?[phone]  ?

## 2021-12-30 NOTE — Consult Note (Signed)
Neurology Consultation ?Reason for Consult: Seizure ?Referring Physician: Jillyn Ledger, L ? ?CC: Concern for seizure activity ? ?History is obtained from: Chart review ? ?HPI: Rodney Buchanan is a 61 y.o. male with a history of alcohol abuse and stroke who presents with concern for seizure activity.  He was drinking and then had an episode concerning for seizure.  He was then witnessed to have a second seizure by EMS.  He was brought into the emergency department where he had an episode of unresponsiveness without shaking.  He was not monitored at the time given that he had ripped his leads off.  Following that he was given Ativan, and has been drowsy but arousable since that time.  I tried calling his wife to give further information, but went straight to voicemail multiple times. ? ?He may have a history of seizures associated with drinking heavily in the past. ? ?ROS: A 14 point ROS was performed and is negative except as noted in the HPI.  ? ?Past Medical History:  ?Diagnosis Date  ? ETOH abuse   ? Stroke [facility])   ? x7  ? ? ? ?Family History  ?Problem Relation Age of Onset  ? Stroke Mother   ? ? ? ?Social History:  reports that he has been smoking cigarettes. He has never used smokeless tobacco. He reports current alcohol use. He reports that he does not currently use drugs. ? ? ?Exam: ?Current vital signs: ?BP 121/87   Pulse 70   Resp 11   SpO2 98%  ?Vital signs in last 24 hours: ?Pulse Rate:  [51-85] 70 (04/15 0100) ?Resp:  [10-20] 11 (04/15 0100) ?BP: (90-128)/(57-109) 121/87 (04/15 0100) ?SpO2:  [84 %-100 %] 98 % (04/15 0100) ? ? ?Physical Exam  ?Constitutional: Appears well-developed and well-nourished.  ?Psych: He is noncooperative with exam ?Eyes: No scleral injection ?HENT: No OP obstruction ?MSK: no joint deformities.  ?Cardiovascular: Normal rate and regular rhythm.  ?Respiratory: Effort normal, non-labored breathing ?GI: Soft.  No distension. There is no tenderness.  ?Skin: WDI ? ?Neuro: ?Mental  Status: ?Patient is somnolent, but once aroused he does tell me his name.  He then asked for blankets and stops cooperating with me. ?Cranial Nerves: ?II: Visual Fields are difficult to assess given that he keeps his eyes tightly shut to the point that I am not able to check blink to threat. Pupils are equal, round, and reactive to light.   ?III,IV, VI: Eyes are midline, he clearly looks at me on the left side at one point, I never see him look to the right but do not suspect gaze preference it was just that he was resisting examination.(And I was on his left) ?V: Facial sensation is symmetric to temperature ?VII: Facial movement is symmetric.  ?VIII: hearing is intact to voice ?X: Uvula elevates symmetrically ?XI: Shoulder shrug is symmetric. ?XII: tongue is midline without atrophy or fasciculations.  ?Motor: ?He moves all extremities with good strength ?Sensory: ?He responds to stimulation in all four extremities ?Cerebellar: ?He does not perform ? ? ? ? ?I have reviewed labs in epic and the results pertinent to this consultation are: ?EtOH 370 ?Sodium 139 ?Calcium 8.6 ?UDS positive for benzodiazepines(he received Versed with EMS) ? ? ?I have reviewed the images obtained: CT head-negative ? 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 ?Impression: 61 year old male with concern for seizure activity.  I was unable to get a hold of anybody to get a description of the episode at home, but the  episode in the ED here had no convulsive activity.  The etiology of that event is unclear.  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  Given that he has had three, I will give him a dose of Keppra at this time.  Binge drinking associated seizure is possible.  I would favor observation and getting an EEG. ? ?Recommendations: ?1) Keppra 1 g x 1 ?2) EEG ?3) neurology will follow ? ?Roland Rack, MD ?[facility] ?[phone] ? ?If 7pm- 7am, please page neurology on call as listed in  [place]. ? ?

## 2021-12-30 NOTE — Progress Notes (Deleted)
EEG complete, pending results ?

## 2021-12-30 NOTE — Progress Notes (Signed)
EEG complete. Pending results ?

## 2021-12-30 NOTE — ED Notes (Signed)
Pt ambulatory in hall with 2 person assist, moderately unsteady gait; Dr. Leonette Monarch present to witness ambulation of pt ?

## 2021-12-30 NOTE — Discharge Instructions (Addendum)
Please rest and stay hydrated and follow-up with your primary doctor and a neurologist.  If any symptoms change or worsen, return to the nearest emergency department. ?

## 2021-12-30 NOTE — ED Notes (Signed)
Family at bedside, updated.  ?

## 2021-12-30 NOTE — ED Notes (Signed)
Called pt's wife re: pending discharge. Per MD, he's working on pt's dc. ?

## 2021-12-30 NOTE — ED Provider Notes (Signed)
7:50 AM ? Care assumed from Dr. Leonette Monarch.  At time of transfer care, patient is awaiting to metabolize EtOH before likely discharge home.  Patient had work-up that was otherwise nonrevealing aside from elevated EtOH of 370 and elevated lactic acid that is downtrending.  Patient had normal kidney function and no evidence of anemia.  CT head and chest x-ray did not show evidence of acute process at this time. ? ?Anticipate metabolism, p.o. challenge, ambulation, and discharged with PCP follow-up. ? ? ?2:29 PM ?After several more hours, patient was able to eat and drink and ambulate safely without difficulty.  He is feeling much better and would like to go home.  Patient was felt appropriate for discharge home now that he is metabolize some of the EtOH that was elevated at 370.  He had no other complaints and will be discharged for outpatient follow-up. ? ?Clinical Impression: ?1. Alcoholic intoxication with complication ([place])   ?2. Seizure ([place])   ? ? ?Disposition: Discharge ? ?Condition: Good ? ?I have discussed the results, Dx and Tx plan with the pt(& family if present). He/she/they expressed understanding and agree(s) with the plan. Discharge instructions discussed at great length. Strict return precautions discussed and pt &/or family have verbalized understanding of the instructions. No further questions at time of discharge.  ? ? ?New Prescriptions  ? No medications on file  ? ? ?Follow Up: ?[facility] ?[place] [id number] ?[phone] ?Schedule an appointment as soon as possible for a visit  ? ? ?[facility] ?[place]     [place] [address] ?[phone] ? ? ? ?[facility] ?[address] ?[id number] ?[place] ?[phone] ? ? ? ? ? ? ? ?  ?Tamikia Chowning, Gwenyth Allegra, MD ?12/30/21 1430 ? ?

## 2022-01-24 ENCOUNTER — Ambulatory Visit (INDEPENDENT_AMBULATORY_CARE_PROVIDER_SITE_OTHER): Payer: Commercial Managed Care - HMO | Admitting: Neurology

## 2022-01-24 ENCOUNTER — Encounter: Payer: Self-pay | Admitting: Neurology

## 2022-01-24 VITALS — BP 150/82 | HR 84 | Ht 69.0 in | Wt 140.0 lb

## 2022-01-24 DIAGNOSIS — R569 Unspecified convulsions: Secondary | ICD-10-CM | POA: Diagnosis not present

## 2022-01-24 DIAGNOSIS — F1010 Alcohol abuse, uncomplicated: Secondary | ICD-10-CM

## 2022-01-24 DIAGNOSIS — R030 Elevated blood-pressure reading, without diagnosis of hypertension: Secondary | ICD-10-CM

## 2022-01-24 NOTE — Progress Notes (Signed)
? ?[facility] ? ?PATIENT: Rodney Buchanan ?DOB: 12-24-1960 ? ?REQUESTING CLINICIAN: No ref. provider found ?HISTORY FROM: Patient  ?REASON FOR VISIT: Seizure like activity  ? ? ?HISTORICAL ? ?CHIEF COMPLAINT:  ?Chief Complaint  ?Patient presents with  ? New Patient (Initial Visit)  ?  Rm 15. Alone. ?ED referral for seizure.  ? ? ?HISTORY OF PRESENT ILLNESS:  ?This is a 61 year old gentleman past medical history of alcohol abuse who is presented with seizure-like activity.  Patient was seen in the ED on April 15 for seizure-like activity.  He remembered that day being at his friend's house, drinking and the next thing that he remembers is being in the hospital.  He does not know what happened in between.  Per chart review EMS was called for his unresponsiveness and seizure-like activity while patient was at the friend's house.  EMS also reported 1 episode concerning for seizures.  He was brought into the hospital, in the ED he had 1 episode of unresponsiveness without any convulsion.  He was given Ativan, and 1 g of Keppra.  His alcohol level at that time was 370.  She was observed without any further seizure-like activity.  He did have a routine EEG which was negative.  He was discharged without antiseizure medication.  Since discharge from the hospital, patient reports that he continues to drink daily but denies any seizure-like activity. ? ?Of note he reported during that time he had a death in his family and he was drinking more than usual. ? ? ?OTHER MEDICAL CONDITIONS: Chronic alcohol abuse  ? ? ?REVIEW OF SYSTEMS: Full 14 system review of systems performed and negative with exception of: As noted in the HPI. ? ?ALLERGIES: ?No Known Allergies ? ?HOME MEDICATIONS: ?Outpatient Medications Prior to Visit  ?Medication Sig Dispense Refill  ? aspirin EC 81 MG tablet Take 81 mg by mouth daily. Swallow whole.    ? ?No facility-administered medications prior to visit.  ? ? ?PAST MEDICAL  HISTORY: ?Past Medical History:  ?Diagnosis Date  ? ETOH abuse   ? Stroke [facility])   ? x7  ? ? ?PAST SURGICAL HISTORY: ?Past Surgical History:  ?Procedure Laterality Date  ? CARDIAC SURGERY    ? Murmur Repair  ? ? ?FAMILY HISTORY: ?Family History  ?Problem Relation Age of Onset  ? Stroke Mother   ? ? ?SOCIAL HISTORY: ?Social History  ? ?Socioeconomic History  ? Marital status: Legally Separated  ?  Spouse name: Not on file  ? Number of children: Not on file  ? Years of education: Not on file  ? Highest education level: Not on file  ?Occupational History  ? Not on file  ?Tobacco Use  ? Smoking status: Every Day  ?  Types: Cigarettes  ? Smokeless tobacco: Never  ?Vaping Use  ? Vaping Use: Never used  ?Substance and Sexual Activity  ? Alcohol use: Yes  ? Drug use: Not Currently  ?  Comment: heroin  ? Sexual activity: Not on file  ?Other Topics Concern  ? Not on file  ?Social History Narrative  ? Not on file  ? ?Social Determinants of Health  ? ?Financial Resource Strain: Not on file  ?Food Insecurity: Not on file  ?Transportation Needs: Not on file  ?Physical Activity: Not on file  ?Stress: Not on file  ?Social Connections: Not on file  ?Intimate Partner Violence: Not on file  ? ? ?PHYSICAL EXAM ? ?GENERAL EXAM/CONSTITUTIONAL: ?Vitals:  ?Vitals:  ? 01/24/22 1007  ?BP: Marland Kitchen)  150/82  ?Pulse: 84  ?Weight: 140 lb (63.5 kg)  ?Height: '5\' 9"'$  (1.753 m)  ? ?Body mass index is 20.67 kg/m?. ?Wt Readings from Last 3 Encounters:  ?01/24/22 140 lb (63.5 kg)  ?01/30/21 145 lb (65.8 kg)  ? ?Patient is in no distress; well developed, nourished and groomed; neck is supple ? ?EYES: ?Pupils round and reactive to light, Visual fields full to confrontation, Extraocular movements intacts,  ? ?MUSCULOSKELETAL: ?Gait, strength, tone, movements noted in Neurologic exam below ? ?NEUROLOGIC: ?MENTAL STATUS:  ?   ? View : No data to display.  ?  ?  ?  ? ?awake, alert, oriented to person, place and time ?recent and remote memory intact ?normal attention  and concentration ?language fluent, comprehension intact, naming intact ?fund of knowledge appropriate ? ?CRANIAL NERVE:  ?2nd - no papilledema or hemorrhages on fundoscopic exam ?2nd, 3rd, 4th, 6th - pupils equal and reactive to light, visual fields full to confrontation, extraocular muscles intact, no nystagmus ?5th - facial sensation symmetric ?7th - facial strength symmetric ?8th - hearing intact ?9th - palate elevates symmetrically, uvula midline ?11th - shoulder shrug symmetric ?12th - tongue protrusion midline ? ?MOTOR:  ?normal bulk and tone, full strength in the BUE, BLE ? ?SENSORY:  ?normal and symmetric to light touch, pinprick, temperature, vibration ? ?COORDINATION:  ?finger-nose-finger, fine finger movements normal ? ?REFLEXES:  ?deep tendon reflexes present and symmetric ? ?GAIT/STATION:  ?normal ? ? ? ? ?DIAGNOSTIC DATA (LABS, IMAGING, TESTING) ?- I reviewed patient records, labs, notes, testing and imaging myself where available. ? ?Lab Results  ?Component Value Date  ? WBC 3.9 (L) 12/29/2021  ? HGB 15.8 12/29/2021  ? HCT 47.3 12/29/2021  ? MCV 104.2 (H) 12/29/2021  ? PLT 180 12/29/2021  ? ?   ?Component Value Date/Time  ? NA 139 12/29/2021 2055  ? K 5.4 (H) 12/29/2021 2055  ? CL 104 12/29/2021 2055  ? CO2 22 12/29/2021 2055  ? GLUCOSE 79 12/29/2021 2055  ? BUN 10 12/29/2021 2055  ? CREATININE 1.16 12/29/2021 2055  ? CALCIUM 8.6 (L) 12/29/2021 2055  ? PROT 6.7 12/29/2021 2055  ? ALBUMIN 4.2 12/29/2021 2055  ? AST 76 (H) 12/29/2021 2055  ? ALT 43 12/29/2021 2055  ? ALKPHOS 95 12/29/2021 2055  ? BILITOT 1.3 (H) 12/29/2021 2055  ? GFRNONAA >60 12/29/2021 2055  ? GFRAA >90 11/28/2014 1657  ? ?Lab Results  ?Component Value Date  ? CHOL  03/09/2009  ?  130        ?ATP III CLASSIFICATION: ? <200     mg/dL   Desirable ? 200-239  mg/dL   Borderline High ? >=240    mg/dL   High ?        ? HDL 30 (L) 03/09/2009  ? [place]  03/09/2009  ?  64        ?Total Cholesterol/HDL:CHD Risk ?Coronary Heart Disease Risk  Table ?                    Men   Women ? 1/2 Average Risk   3.4   3.3 ? Average Risk       5.0   4.4 ? 2 X Average Risk   9.6   7.1 ? 3 X Average Risk  23.4   11.0 ?       ?Use the calculated Patient Ratio ?above and the CHD Risk Table ?to determine the patient's CHD Risk. ?       ?  ATP III CLASSIFICATION (LDL): ? <100     mg/dL   Optimal ? 100-129  mg/dL   Near or Above ?                   Optimal ? 130-159  mg/dL   Borderline ? 160-189  mg/dL   High ? >190     mg/dL   Very High  ? TRIG 182 (H) 03/09/2009  ? CHOLHDL 4.3 03/09/2009  ? ?Lab Results  ?Component Value Date  ? HGBA1C  03/08/2009  ?  4.9 ?(NOTE) The ADA recommends the following therapeutic goal for glycemic control related to Hgb A1c measurement: Goal of therapy: <6.5 Hgb A1c  Reference: American Diabetes Association: Clinical Practice Recommendations 2010, Diabetes Care, 2010, 33: (Suppl ? 1).  ? ?No results found for: VITAMINB12 ?No results found for: TSH ? ? ?Head CT 12/29/2021 ?1. No acute intracranial process. ?2. Extensive chronic microvascular ischemic changes. ? ? ?Routine EEG 12/30/21 ?This study is within normal limits. No seizures or epileptiform discharges were seen throughout the recording ? ? ? ?ASSESSMENT AND PLAN ? ?61 y.o. year old male with history of alcohol abuse who is presenting for seizure-like activity.  Patient was seen in the hospital on April 14 after witnessed seizure-like activity.  At that time he was also intoxicated and his alcohol blood level was 370.  He did have a routine EEG which was negative for any acute abnormality.  Since discharge from the hospital he continues to drink daily but denies any seizure-like activity.  I have discussed at length the need to stop alcohol.  ?I explained to the patient that alcohol is toxic to the brain and the best course of action is to stop drinking.  Regarding his seizure-like activity, it was likely related to acute intoxication.  I agree with holding antiseizure medication at the  moment.  If he continues to have seizures, then it is reasonable to start antiseizure medication.  I also advised him to set up care with a primary care doctor.  His blood pressure today was elevated and he is head C

## 2022-01-24 NOTE — Patient Instructions (Addendum)
Set up care with primary care physician  ?Discussed alcohol cessation  ?Continue with aspirin ?Return as needed  ?

## 2022-05-15 ENCOUNTER — Other Ambulatory Visit: Payer: Self-pay

## 2022-05-15 ENCOUNTER — Encounter (HOSPITAL_COMMUNITY): Payer: Self-pay

## 2022-05-15 ENCOUNTER — Emergency Department (HOSPITAL_COMMUNITY)
Admission: EM | Admit: 2022-05-15 | Discharge: 2022-05-16 | Disposition: A | Discharge status: Home / Self Care | Payer: Commercial Managed Care - HMO | Attending: Emergency Medicine | Admitting: Emergency Medicine

## 2022-05-15 DIAGNOSIS — F32A Depression, unspecified: Secondary | ICD-10-CM | POA: Diagnosis not present

## 2022-05-15 DIAGNOSIS — Z7982 Long term (current) use of aspirin: Secondary | ICD-10-CM | POA: Diagnosis not present

## 2022-05-15 DIAGNOSIS — F1014 Alcohol abuse with alcohol-induced mood disorder: Secondary | ICD-10-CM | POA: Insufficient documentation

## 2022-05-15 DIAGNOSIS — F10129 Alcohol abuse with intoxication, unspecified: Secondary | ICD-10-CM

## 2022-05-15 DIAGNOSIS — F1094 Alcohol use, unspecified with alcohol-induced mood disorder: Secondary | ICD-10-CM | POA: Diagnosis not present

## 2022-05-15 DIAGNOSIS — Z046 Encounter for general psychiatric examination, requested by authority: Secondary | ICD-10-CM | POA: Diagnosis present

## 2022-05-15 DIAGNOSIS — F10120 Alcohol abuse with intoxication, uncomplicated: Secondary | ICD-10-CM | POA: Diagnosis not present

## 2022-05-15 DIAGNOSIS — F1010 Alcohol abuse, uncomplicated: Secondary | ICD-10-CM

## 2022-05-15 DIAGNOSIS — Y907 Blood alcohol level of 200-239 mg/100 ml: Secondary | ICD-10-CM | POA: Insufficient documentation

## 2022-05-15 DIAGNOSIS — R45851 Suicidal ideations: Secondary | ICD-10-CM | POA: Diagnosis not present

## 2022-05-15 LAB — COMPREHENSIVE METABOLIC PANEL
ALT: 31 U/L (ref 0–44)
AST: 39 U/L (ref 15–41)
Albumin: 4.1 g/dL (ref 3.5–5.0)
Alkaline Phosphatase: 79 U/L (ref 38–126)
Anion gap: 13 (ref 5–15)
BUN: 8 mg/dL (ref 8–23)
CO2: 23 mmol/L (ref 22–32)
Calcium: 9.3 mg/dL (ref 8.9–10.3)
Chloride: 107 mmol/L (ref 98–111)
Creatinine, Ser: 1.05 mg/dL (ref 0.61–1.24)
GFR, Estimated: >60 mL/min (ref >60)
Glucose, Bld: 88 mg/dL (ref 70–99)
Potassium: 4.3 mmol/L (ref 3.5–5.1)
Sodium: 143 mmol/L (ref 135–145)
Total Bilirubin: 1.2 mg/dL (ref 0.3–1.2)
Total Protein: 6.6 g/dL (ref 6.5–8.1)

## 2022-05-15 LAB — RAPID URINE DRUG SCREEN, HOSP PERFORMED
Amphetamines: NOT DETECTED
Barbiturates: NOT DETECTED
Benzodiazepines: NOT DETECTED
Cocaine: NOT DETECTED
Opiates: NOT DETECTED
Tetrahydrocannabinol: NOT DETECTED

## 2022-05-15 LAB — CBC WITH DIFFERENTIAL/PLATELET
Abs Immature Granulocytes: 0 10*3/uL (ref 0.00–0.07)
Basophils Absolute: 0.1 10*3/uL (ref 0.0–0.1)
Basophils Relative: 2 %
Eosinophils Absolute: 0.2 10*3/uL (ref 0.0–0.5)
Eosinophils Relative: 4 %
HCT: 43.4 % (ref 39.0–52.0)
Hemoglobin: 14.8 g/dL (ref 13.0–17.0)
Immature Granulocytes: 0 %
Lymphocytes Relative: 52 %
Lymphs Abs: 1.8 10*3/uL (ref 0.7–4.0)
MCH: 34.7 pg — ABNORMAL HIGH (ref 26.0–34.0)
MCHC: 34.1 g/dL (ref 30.0–36.0)
MCV: 101.6 fL — ABNORMAL HIGH (ref 80.0–100.0)
Monocytes Absolute: 0.3 10*3/uL (ref 0.1–1.0)
Monocytes Relative: 9 %
Neutro Abs: 1.1 10*3/uL — ABNORMAL LOW (ref 1.7–7.7)
Neutrophils Relative %: 33 %
Platelets: 195 10*3/uL (ref 150–400)
RBC: 4.27 MIL/uL (ref 4.22–5.81)
RDW: 13.2 % (ref 11.5–15.5)
WBC: 3.4 10*3/uL — ABNORMAL LOW (ref 4.0–10.5)
nRBC: 0 % (ref 0.0–0.2)

## 2022-05-15 LAB — ETHANOL: Alcohol, Ethyl (B): 228 mg/dL — ABNORMAL HIGH (ref <10)

## 2022-05-15 MED ORDER — LORAZEPAM 1 MG PO TABS
0.0000 mg | ORAL_TABLET | Freq: Four times a day (QID) | ORAL | Status: DC
  Administered 2022-05-15: 1 mg via ORAL
  Filled 2022-05-15: qty 1

## 2022-05-15 MED ORDER — LORAZEPAM 2 MG/ML IJ SOLN: 0.0000 mg | Freq: Four times a day (QID) | INTRAMUSCULAR | Status: DC

## 2022-05-15 MED ORDER — LORAZEPAM 1 MG PO TABS: 0.0000 mg | ORAL_TABLET | Freq: Two times a day (BID) | ORAL | Status: DC

## 2022-05-15 MED ORDER — LORAZEPAM 2 MG/ML IJ SOLN: 0.0000 mg | Freq: Two times a day (BID) | INTRAMUSCULAR | Status: DC

## 2022-05-15 MED ORDER — THIAMINE MONONITRATE 100 MG PO TABS
100.0000 mg | ORAL_TABLET | Freq: Every day | ORAL | Status: DC
  Administered 2022-05-15: 100 mg via ORAL
  Filled 2022-05-15 (×4): qty 1

## 2022-05-15 MED ORDER — THIAMINE HCL 100 MG/ML IJ SOLN
100.0000 mg | Freq: Every day | INTRAMUSCULAR | Status: DC
  Filled 2022-05-15: qty 1

## 2022-05-15 NOTE — ED Notes (Signed)
TTS in process 

## 2022-05-15 NOTE — ED Provider Notes (Signed)
[facility] EMERGENCY DEPARTMENT Provider Note   CSN: [id number] Arrival date & time: 05/15/22  1638     History  Chief Complaint  Patient presents with   Psychiatric Evaluation    Rodney Buchanan is a 61 y.o. male.  HPI   This patient is a 61 year old male, he has a history of very heavy alcohol use in fact he states that he drinks somewhere between a quarter and a half a gallon of liquor every day.  He lost his sister about 3 months ago to cancer and states that he has been thinking about her every day, every time he does he drinks heavily.  He reports that he took the day off of work today, he was talking to his friends about death, he denies saying anything about suicidality however the report from police was that he had talked about shooting himself and that he had access to guns.  The patient is denying that he said this however he reports that everybody is going to die it is just a matter of time, he seems very distraught.  He denies having any hallucinations and does not appear to be responding to any internal stimuli at this time.  Home Medications Prior to Admission medications   Medication Sig Start Date End Date Taking? Authorizing Provider  aspirin EC 81 MG tablet Take 81 mg by mouth daily. Swallow whole. Patient not taking: Reported on 05/15/2022    [provider]      Allergies    Patient has no known allergies.    Review of Systems   Review of Systems  All other systems reviewed and are negative.   Physical Exam Updated Vital Signs BP 126/86 (BP Location: Right Arm)   Pulse 74   Temp 98.2 F (36.8 C) (Oral)   Resp 16   Ht 1.753 m ('5\' 9"'$ )   Wt 63.5 kg   SpO2 90%   BMI 20.67 kg/m  Physical Exam Vitals and nursing note reviewed.  Constitutional:      General: He is not in acute distress.    Appearance: He is well-developed.  HENT:     Head: Normocephalic and atraumatic.     Mouth/Throat:     Pharynx: No oropharyngeal  exudate.  Eyes:     General: No scleral icterus.       Right eye: No discharge.        Left eye: No discharge.     Conjunctiva/sclera: Conjunctivae normal.     Pupils: Pupils are equal, round, and reactive to light.  Neck:     Thyroid: No thyromegaly.     Vascular: No JVD.  Cardiovascular:     Rate and Rhythm: Normal rate and regular rhythm.     Heart sounds: Normal heart sounds. No murmur heard.    No friction rub. No gallop.  Pulmonary:     Effort: Pulmonary effort is normal. No respiratory distress.     Breath sounds: Normal breath sounds. No wheezing or rales.  Abdominal:     General: Bowel sounds are normal. There is no distension.     Palpations: Abdomen is soft. There is no mass.     Tenderness: There is no abdominal tenderness.  Musculoskeletal:        General: No tenderness. Normal range of motion.     Cervical back: Normal range of motion and neck supple.  Lymphadenopathy:     Cervical: No cervical adenopathy.  Skin:    General: Skin is  warm and dry.     Findings: No erythema or rash.  Neurological:     Mental Status: He is alert.     Coordination: Coordination normal.     Comments: No facial droop, no slurred speech  Psychiatric:     Comments: The patient has a slightly depressed affect, he is talking about death, he endorses heavy alcohol use, denies a suicidal ideations to me and denies hallucinations     ED Results / Procedures / Treatments   Labs (all labs ordered are listed, but only abnormal results are displayed) Labs Reviewed  ETHANOL - Abnormal; Notable for the following components:      Result Value   Alcohol, Ethyl (B) 228 (*)    All other components within normal limits  CBC WITH DIFFERENTIAL/PLATELET - Abnormal; Notable for the following components:   WBC 3.4 (*)    MCV 101.6 (*)    MCH 34.7 (*)    Neutro Abs 1.1 (*)    All other components within normal limits  RESP PANEL BY RT-PCR (FLU A&B, COVID) [id number]  COMPREHENSIVE METABOLIC PANEL   RAPID URINE DRUG SCREEN, HOSP PERFORMED    EKG EKG Interpretation  Date/Time:  Tuesday May 15 2022 17:09:00 EDT Ventricular Rate:  65 PR Interval:  162 QRS Duration: 86 QT Interval:  422 QTC Calculation: 438 R Axis:   73 Text Interpretation: Normal sinus rhythm Normal ECG When compared with ECG of 30-Dec-2021 02:56, PREVIOUS ECG IS PRESENT Confirmed by Noemi Chapel [phone]) on 05/15/2022 5:16:50 PM  Radiology No results found.  Procedures Procedures    Medications Ordered in ED Medications  LORazepam (ATIVAN) injection 0-4 mg ( Intravenous See Alternative 05/15/22 2137)    Or  LORazepam (ATIVAN) tablet 0-4 mg (1 mg Oral Given 05/15/22 2137)  LORazepam (ATIVAN) injection 0-4 mg (has no administration in time range)    Or  LORazepam (ATIVAN) tablet 0-4 mg (has no administration in time range)  thiamine (VITAMIN B1) tablet 100 mg (100 mg Oral Given 05/15/22 2137)    Or  thiamine (VITAMIN B1) injection 100 mg ( Intravenous See Alternative 05/15/22 2137)    ED Course/ Medical Decision Making/ A&P                           Medical Decision Making  This patient presents to the ED for concern of suicidal ideations and alcohol intoxication differential diagnosis includes the patient is possibly just talking out of his severe sorrow with the loss of his sister, this could be related to his heavy alcohol use, he does not appear suicidal and his affect but has heavy undertones of talking about death and will need clearance from psychiatry    Additional history obtained:  Additional history obtained from electronic medical record External records from outside source obtained and reviewed including the patient has had multiple ED visits related to alcohol abuse, he even had a office visit with neurology related alcohol abuse back in May, no recent admissions to the hospital since a stroke admission back in 2010   Lab Tests:  I Ordered, and personally interpreted labs.  The  pertinent results include: CBC unremarkable, MCV is unsurprisingly elevated.  Metabolic panel and alcohol level pending    Medicines ordered and prescription drug management:   Reevaluation of the patient after these medicines showed that the patient remained the same I have reviewed the patients home medicines and have made adjustments as needed   Problem  List / ED Course:  The patient remains with alcohol intoxication and suicidal complaints intermittently.  Seen by psychiatry and recommended that the patient needs to be admitted to an acute based unit in a behavioral health hospital   Social Determinants of Health:  Substance abuse           Final Clinical Impression(s) / ED Diagnoses Final diagnoses:  Alcohol abuse  Suicidal ideation    Rx / DC Orders ED Discharge Orders     None         Noemi Chapel, MD 05/15/22 2334
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
## 2022-05-15 NOTE — ED Notes (Signed)
Patient refuses COVID swab 

## 2022-05-15 NOTE — ED Triage Notes (Signed)
PD brought patient in due to having suicidal ideation and being intoxicated.  Patient does not have a plan but access to guns.  When triaging patient denies being SI or HI.  Patient currently arguing with PD saying he didn't state he was going to kill hiimself and if that was the case he would have shot himself.  Patient refusing to changing into purple scrubs and PA has been advised to IVC patient per Dr Doren Custard

## 2022-05-15 NOTE — BH Assessment (Signed)
Clinician messaged Libby Maw Pflueger, RN: "Hey. It's Trey with TTS. Is the pt able to engage in the assessment, if so the pt will need to be placed in a private room. Is the pt under IVC? Also is the pt medically cleared? Please fax pt's IVC to [phone]."  Pt's RN working on locating private room.    Vertell Novak, [place], [facility], [facility] Triage Specialist [phone]

## 2022-05-15 NOTE — ED Notes (Signed)
Patient brought in by [facility] for evaluation. States patient told them "he was tired and was just trying to find a way to finish it off." Patient is obviously intoxicated, NP in triage has started IVC process. Patient calmly speaking with physician at this time.

## 2022-05-15 NOTE — ED Notes (Signed)
Pt belongings placed in locker number 1

## 2022-05-15 NOTE — ED Notes (Signed)
Patient shoes removed and placed with belongings in locker #4

## 2022-05-16 DIAGNOSIS — F10129 Alcohol abuse with intoxication, unspecified: Secondary | ICD-10-CM

## 2022-05-16 DIAGNOSIS — F1094 Alcohol use, unspecified with alcohol-induced mood disorder: Secondary | ICD-10-CM

## 2022-05-16 NOTE — Progress Notes (Signed)
CSW was not notified by the psychiatry department for CSW to speak with patient about psych medication management that is stated in their notes. Patient was already discharge and CSW was not able to discuss anything about psych medication management.

## 2022-05-16 NOTE — Progress Notes (Signed)
CSW attached substance abuse resources to patients AVS. CSW also provided the number and address to [facility] that can provide therapy and medication management.

## 2022-05-16 NOTE — ED Provider Notes (Signed)
  Physical Exam  BP 139/76 (BP Location: Left Arm)   Pulse (!) 47   Temp 98.2 F (36.8 C) (Oral)   Resp 16   Ht '5\' 9"'$  (1.753 m)   Wt 63.5 kg   SpO2 97%   BMI 20.67 kg/m   Physical Exam  Procedures  Procedures  ED Course / MDM    Medical Decision Making  Patient seen by psychiatry and cleared for discharge home.  Resources given.  Does not appear to need medication treatment at this time.  I have seen patient and reversed IVC       Davonna Belling, MD 05/16/22 1428

## 2022-05-16 NOTE — ED Notes (Signed)
Pt DC home CIWA not needed

## 2022-05-16 NOTE — Discharge Instructions (Addendum)
[facility]  [place], [place] [address] [phone] *Provides outpatient therapy and medication management

## 2022-05-16 NOTE — ED Notes (Signed)
Pt has refused COVID test during his admission to ED.

## 2022-05-16 NOTE — ED Notes (Signed)
PT DC home with family

## 2022-05-16 NOTE — Consult Note (Signed)
Telepsych Consultation   Reason for Consult:  Psychiatric Reassessment  Referring Physician:  Noemi Chapel, MD Location of Patient:    [facility] Location of Provider: Other: virtual home office  Patient Identification: Rodney Buchanan MRN:  [id number] Principal Diagnosis: Alcohol use with alcohol-induced mood disorder ([place]) Diagnosis:  Principal Problem:   Alcohol use with alcohol-induced mood disorder ([place]) Active Problems:   Alcohol abuse with intoxication ([place])   Total Time spent with patient: 30 minutes  Subjective:   Rodney Buchanan is a 61 y.o. male patient admitted with alcohol abuse, states he wanted resources for sobriety. His admission BAL was 228; pt has been evaluated previously at [facility] ED with similar presentation December 29, 2021, at that time his BAL was 370.    HPI:  Patient seen via telepsych by this provider; chart reviewed and consulted with Dr. Dwyane Dee on 05/16/22.  On evaluation Rodney Buchanan reports he came in to the emergency room yesterday because he wanted to get resources to stop drinking.  Reports he's been drinking for year but no longer wants to do this as he's aware it has impacted his family.  He does endorse personal loss, his sister died a few months ago, he's still mourning her but states he has never tried commit suicide; and vehemently states he never told the police or anyone else that he was suicidal.  He also states he does not have a firearm and denies access to firearms.  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  States he does not feel he needs inpatient care but would like outpatient resources for SUD. He denies illicit substance usage.   His appetite is fair, gets about 7-7.5 hours of sleep daily.  Lives at home with his wife.  He cites his family, and work as strong protective factors against self harm.  He has 5 daughters and 1 son,  he talks with 2 of them daily.  He has 7 grandchildren, who look up to him and he wants to live for. Pt has already been medically cleared prior to assessment.   During evaluation Rodney Buchanan is sitting upright, dangled at the bedside; He hair is combed and he is neat but wearing hospital scrubs, he does not appear disheveled and no ADL deficits seen. He is alert/oriented x 4; calm/cooperative; and mood congruent with affect.  Patient is speaking in a clear tone at moderate volume, and normal pace; with good eye contact.  His thought process is coherent and relevant; There is no indication that he is currently responding to internal/external stimuli or experiencing delusional thought content.  Patient denies suicidal/self-harm/homicidal ideation, psychosis, and paranoia.  Patient has remained calm throughout assessment and has answered questions appropriately.   Per ED Provider Admissions Assessment 05/14/2022;  Chief Complaint  Patient presents with   Psychiatric Evaluation      Rodney Buchanan is a 61 y.o. male.   HPI    This patient is a 61 year old male, he has a history of very heavy alcohol use in fact he states that he drinks somewhere between a quarter and a half a gallon of liquor every day.  He lost his sister about 3 months ago to cancer and states that he has been thinking about her every day, every time he does he drinks heavily.  He reports that he took the day off of work today, he  was talking to his friends about death, he denies saying anything about suicidality however the report from police was that he had talked about shooting himself and that he had access to guns.  The patient is denying that he said this however he reports that everybody is going to die it is just a matter of time, he seems very distraught.  He denies having any hallucinations and does not appear to be responding to any internal stimuli at this time.   Past Psychiatric History: alcohol abuse  Risk to  Self:  no Risk to Others:  no Prior Inpatient Therapy:  denies Prior Outpatient Therapy:  denies  Past Medical History:  Past Medical History:  Diagnosis Date   ETOH abuse    Stroke ([place])    x7    Past Surgical History:  Procedure Laterality Date   CARDIAC SURGERY     Murmur Repair   Family History:  Family History  Problem Relation Age of Onset   Stroke Mother    Family Psychiatric  History: denies  Social History:  Social History   Substance and Sexual Activity  Alcohol Use Yes     Social History   Substance and Sexual Activity  Drug Use Not Currently   Comment: heroin    Social History   Socioeconomic History   Marital status: Legally Separated    Spouse name: Not on file   Number of children: Not on file   Years of education: Not on file   Highest education level: Not on file  Occupational History   Not on file  Tobacco Use   Smoking status: Every Day    Types: Cigarettes   Smokeless tobacco: Never  Vaping Use   Vaping Use: Never used  Substance and Sexual Activity   Alcohol use: Yes   Drug use: Not Currently    Comment: heroin   Sexual activity: Not on file  Other Topics Concern   Not on file  Social History Narrative   Not on file   Social Determinants of Health   Financial Resource Strain: Not on file  Food Insecurity: Not on file  Transportation Needs: Not on file  Physical Activity: Not on file  Stress: Not on file  Social Connections: Not on file   Additional Social History:    Allergies:  No Known Allergies  Labs:  Results for orders placed or performed during the hospital encounter of 05/15/22 (from the past 48 hour(s))  Comprehensive metabolic panel     Status: None   Collection Time: 05/15/22  5:13 PM  Result Value Ref Range   Sodium 143 135 - 145 mmol/L   Potassium 4.3 3.5 - 5.1 mmol/L   Chloride 107 98 - 111 mmol/L   CO2 23 22 - 32 mmol/L   Glucose, Bld 88 70 - 99 mg/dL    Comment: Glucose reference range applies  only to samples taken after fasting for at least 8 hours.   BUN 8 8 - 23 mg/dL   Creatinine, Ser 1.05 0.61 - 1.24 mg/dL   Calcium 9.3 8.9 - 10.3 mg/dL   Total Protein 6.6 6.5 - 8.1 g/dL   Albumin 4.1 3.5 - 5.0 g/dL   AST 39 15 - 41 U/L   ALT 31 0 - 44 U/L   Alkaline Phosphatase 79 38 - 126 U/L   Total Bilirubin 1.2 0.3 - 1.2 mg/dL   GFR, Estimated >60 >60 mL/min    Comment: (NOTE) Calculated using the CKD-EPI Creatinine Equation (  2021)    Anion gap 13 5 - 15    Comment: Performed at [facility], [place] [address], [place], [place] [address]  CBC with Diff     Status: Abnormal   Collection Time: 05/15/22  5:13 PM  Result Value Ref Range   WBC 3.4 (L) 4.0 - 10.5 K/uL   RBC 4.27 4.22 - 5.81 MIL/uL   Hemoglobin 14.8 13.0 - 17.0 g/dL   HCT 43.4 39.0 - 52.0 %   MCV 101.6 (H) 80.0 - 100.0 fL   MCH 34.7 (H) 26.0 - 34.0 pg   MCHC 34.1 30.0 - 36.0 g/dL   RDW 13.2 11.5 - 15.5 %   Platelets 195 150 - 400 K/uL   nRBC 0.0 0.0 - 0.2 %   Neutrophils Relative % 33 %   Neutro Abs 1.1 (L) 1.7 - 7.7 K/uL   Lymphocytes Relative 52 %   Lymphs Abs 1.8 0.7 - 4.0 K/uL   Monocytes Relative 9 %   Monocytes Absolute 0.3 0.1 - 1.0 K/uL   Eosinophils Relative 4 %   Eosinophils Absolute 0.2 0.0 - 0.5 K/uL   Basophils Relative 2 %   Basophils Absolute 0.1 0.0 - 0.1 K/uL   Immature Granulocytes 0 %   Abs Immature Granulocytes 0.00 0.00 - 0.07 K/uL    Comment: Performed at [facility], [place] [address], [place], [place] [address]  Ethanol     Status: Abnormal   Collection Time: 05/15/22  5:14 PM  Result Value Ref Range   Alcohol, Ethyl (B) 228 (H) <10 mg/dL    Comment: (NOTE) Lowest detectable limit for serum alcohol is 10 mg/dL.  For medical purposes only. Performed at [facility], [place] [address], [place], [place] [address]   Urine rapid drug screen (hosp performed)     Status: None   Collection Time: 05/15/22 10:00 PM  Result Value Ref Range   Opiates NONE DETECTED NONE  DETECTED   Cocaine NONE DETECTED NONE DETECTED   Benzodiazepines NONE DETECTED NONE DETECTED   Amphetamines NONE DETECTED NONE DETECTED   Tetrahydrocannabinol NONE DETECTED NONE DETECTED   Barbiturates NONE DETECTED NONE DETECTED    Comment: (NOTE) DRUG SCREEN FOR MEDICAL PURPOSES ONLY.  IF CONFIRMATION IS NEEDED FOR ANY PURPOSE, NOTIFY LAB WITHIN 5 DAYS.  LOWEST DETECTABLE LIMITS FOR URINE DRUG SCREEN Drug Class                     Cutoff (ng/mL) Amphetamine and metabolites    1000 Barbiturate and metabolites    200 Benzodiazepine                 712 Tricyclics and metabolites     300 Opiates and metabolites        300 Cocaine and metabolites        300 THC                            50 Performed at [facility], [place] [address] [place], [place] [address]     Medications:  Current Facility-Administered Medications  Medication Dose Route Frequency Provider Last Rate Last Admin   LORazepam (ATIVAN) injection 0-4 mg  0-4 mg Intravenous Q6H Redwine, Madison A, PA-C       Or   LORazepam (ATIVAN) tablet 0-4 mg  0-4 mg Oral Q6H Redwine, Madison A, PA-C   1 mg at 05/15/22 2137   [  START ON 05/18/2022] LORazepam (ATIVAN) injection 0-4 mg  0-4 mg Intravenous Q12H Redwine, Madison A, PA-C       Or   [START ON 05/18/2022] LORazepam (ATIVAN) tablet 0-4 mg  0-4 mg Oral Q12H Redwine, Madison A, PA-C       thiamine (VITAMIN B1) tablet 100 mg  100 mg Oral Daily Redwine, Madison A, PA-C   100 mg at 05/15/22 2137   Or   thiamine (VITAMIN B1) injection 100 mg  100 mg Intravenous Daily Redwine, Madison A, PA-C       Current Outpatient Medications  Medication Sig Dispense Refill   aspirin EC 81 MG tablet Take 81 mg by mouth daily. Swallow whole. (Patient not taking: Reported on 05/15/2022)      Musculoskeletal:pt ambulates independently and moves all extremities Strength & Muscle Tone: within normal limits Gait & Station: normal Patient leans: N/A   Psychiatric Specialty  Exam:  Presentation  General Appearance: No data recorded Eye Contact:No data recorded Speech:No data recorded Speech Volume:No data recorded Handedness:No data recorded  Mood and Affect  Mood:No data recorded Affect:No data recorded  Thought Process  Thought Processes:No data recorded Descriptions of Associations:No data recorded Orientation:No data recorded Thought Content:No data recorded History of Schizophrenia/Schizoaffective disorder:No data recorded Duration of Psychotic Symptoms:No data recorded Hallucinations:No data recorded Ideas of Reference:No data recorded Suicidal Thoughts:No data recorded Homicidal Thoughts:No data recorded  Sensorium  Memory:No data recorded Judgment:No data recorded Insight:No data recorded  Executive Functions  Concentration:No data recorded Attention Span:No data recorded Recall:No data recorded Fund of Knowledge:No data recorded Language:No data recorded  Psychomotor Activity  Psychomotor Activity:No data recorded  Assets  Assets:No data recorded  Sleep  Sleep:No data recorded   Physical Exam: Physical Exam Constitutional:      Appearance: Normal appearance.  Cardiovascular:     Rate and Rhythm: Normal rate.     Pulses: Normal pulses.  Pulmonary:     Effort: Pulmonary effort is normal.  Musculoskeletal:        General: Normal range of motion.  Neurological:     Mental Status: He is alert.  Psychiatric:        Attention and Perception: Attention and perception normal.        Mood and Affect: Mood normal.        Speech: Speech normal.        Behavior: Behavior normal. Behavior is cooperative.        Thought Content: Thought content normal. Thought content is not paranoid or delusional. Thought content does not include homicidal or suicidal ideation. Thought content does not include homicidal or suicidal plan.        Cognition and Memory: Cognition and memory normal.        Judgment: Judgment is impulsive (as  evidenced by chronic use.).    Review of Systems  Constitutional: Negative.   HENT: Negative.    Eyes: Negative.   Respiratory: Negative.    Cardiovascular: Negative.   Gastrointestinal: Negative.   Genitourinary: Negative.   Musculoskeletal: Negative.   Skin: Negative.   Neurological:  Negative for dizziness, tremors, focal weakness, seizures and weakness.  Endo/Heme/Allergies: Negative.   Psychiatric/Behavioral:  Positive for substance abuse.    Blood pressure 139/76, pulse (!) 47, temperature 98.2 F (36.8 C), temperature source Oral, resp. rate 16, height '5\' 9"'$  (1.753 m), weight 63.5 kg, SpO2 97 %. Body mass index is 20.67 kg/m.  Treatment Plan Summary: Patient presents to ED for alcohol abuse requesting SUD treatment, per  police reports pt had suicidal ideations that involved a firearm.  Pt seen psych eval today.  He is alert and oriented, and fully engaged in visit, no s/sx of alcohol impairment.  He is clear and coherent, and denies suicidal ideations on admission, denies HI, and no AVH.  At baseline is judgment is impaired d/t chronic alcoholism but in speaking with him today, there are no acute safety concerns.  He has good insight and strong familial protective factors against self harm.  Pt declined [facility] to [facility] for detox and mental health med eval/mgmt but elects to continue with outpatient resources for now.  He cites work as a barrier to accepting inpatient care at the [facility].    Plan- As per above assessment, there are no current grounds for involuntary commitment at this time.  Patient is not currently interested in inpatient services but expresses agreement to continue outpatient treatment., we have reviewed importance of substance abuse abstinence, potential negative impact substance abuse can have on his relationships and level of functioning, and importance of medication compliance.  I have asked Raina Mina, LCSW to include resources for [facility] for SUD, therapy and  to discuss psych med mgmt.   Disposition: No evidence of imminent risk to self or others at present.   Patient does not meet criteria for psychiatric inpatient admission. Supportive therapy provided about ongoing stressors. Discussed crisis plan, support from social network, calling 911, coming to the Emergency Department, and calling Suicide Hotline.  This service was provided via telemedicine using a 2-way, interactive audio and video technology.  Names of all persons participating in this telemedicine service and their role in this encounter. Name: Rodney Buchanan Role: Patient  Name: Merlyn Lot Role: PMHNP    Mallie Darting, NP 05/16/2022 2:25 PM

## 2023-01-03 DIAGNOSIS — Z0001 Encounter for general adult medical examination with abnormal findings: Secondary | ICD-10-CM | POA: Diagnosis not present

## 2023-01-03 DIAGNOSIS — Z13220 Encounter for screening for lipoid disorders: Secondary | ICD-10-CM | POA: Diagnosis not present

## 2023-01-03 DIAGNOSIS — Z129 Encounter for screening for malignant neoplasm, site unspecified: Secondary | ICD-10-CM | POA: Diagnosis not present

## 2023-01-03 DIAGNOSIS — Z131 Encounter for screening for diabetes mellitus: Secondary | ICD-10-CM | POA: Diagnosis not present

## 2023-01-03 DIAGNOSIS — F17200 Nicotine dependence, unspecified, uncomplicated: Secondary | ICD-10-CM | POA: Diagnosis not present

## 2023-01-03 DIAGNOSIS — K4090 Unilateral inguinal hernia, without obstruction or gangrene, not specified as recurrent: Secondary | ICD-10-CM | POA: Diagnosis not present

## 2023-01-03 DIAGNOSIS — F1010 Alcohol abuse, uncomplicated: Secondary | ICD-10-CM | POA: Diagnosis not present

## 2023-02-05 IMAGING — CT CT HEAD W/O CM
3 of 4 series · 15 of 47 positions shown, 18 images · non-contrast
Comparison: 01/30/2021.

CLINICAL DATA: Altered mental status.



[Series 3: head 2.0 h70h · axial · 0.42mm/px · z∈[-51,+77]mm · 9 of 82 slices shown, 12 images]
[im 9/82  brain]
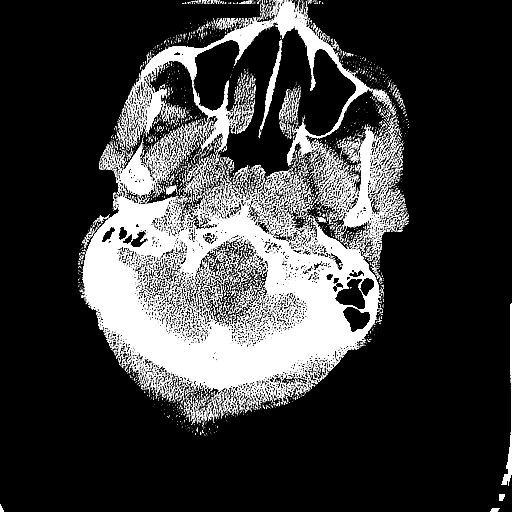
[im 9/82  bone]
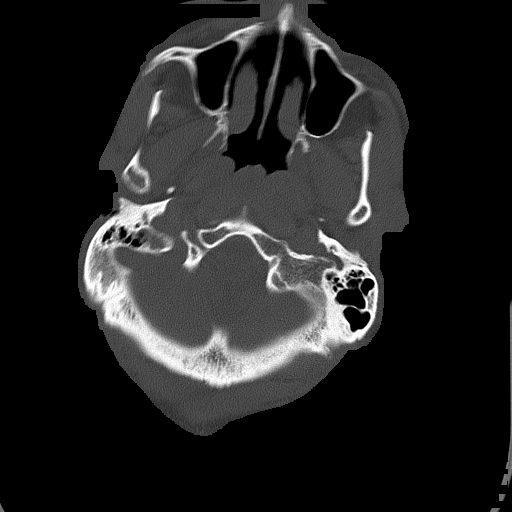
[im 17/82  brain]
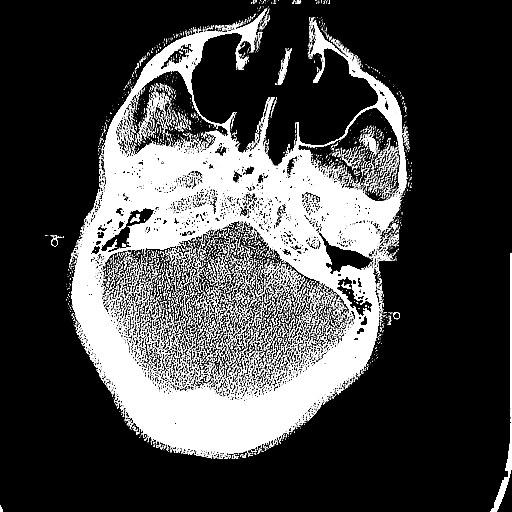
[im 25/82  brain]
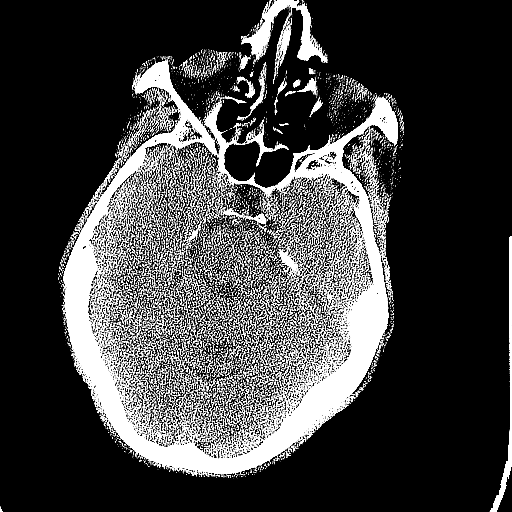
[im 33/82  brain]
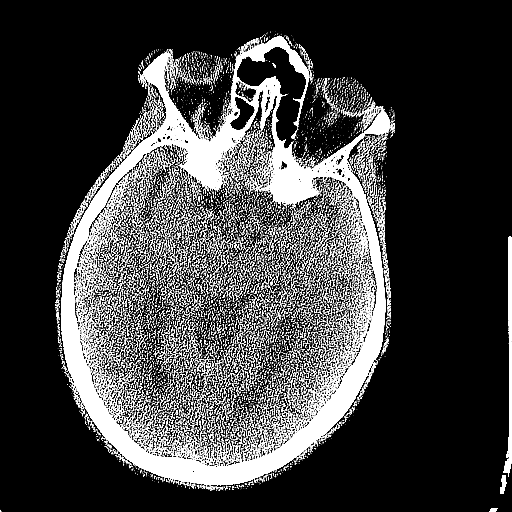
[im 41/82  brain]
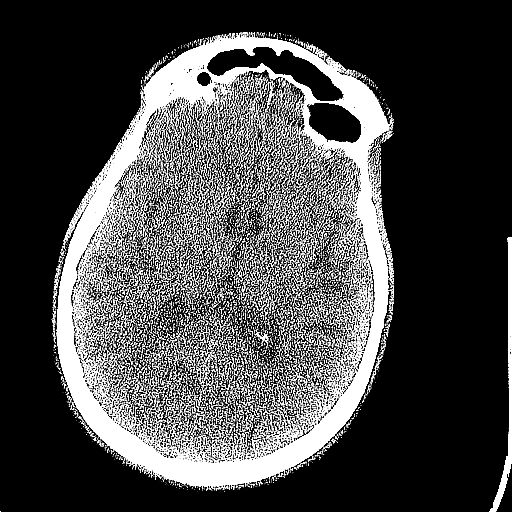
[im 41/82  bone]
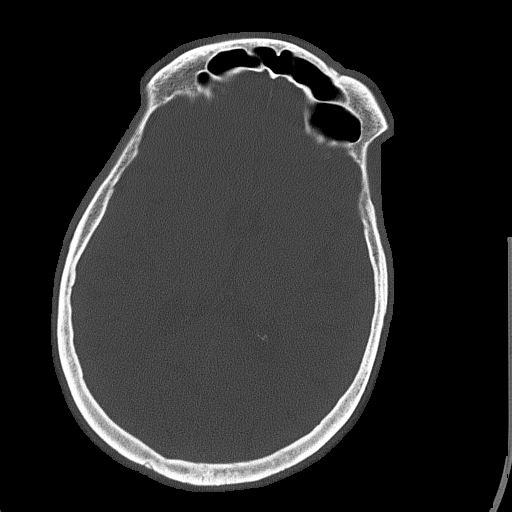
[im 49/82  brain]
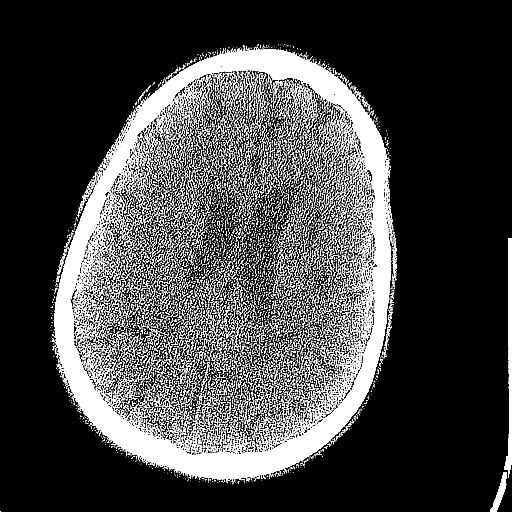
[im 57/82  brain]
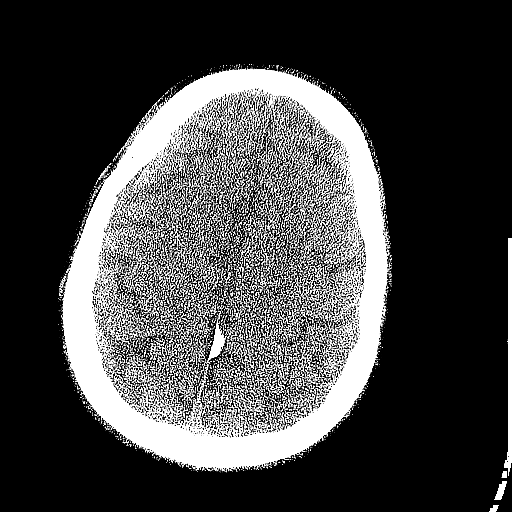
[im 65/82  brain]
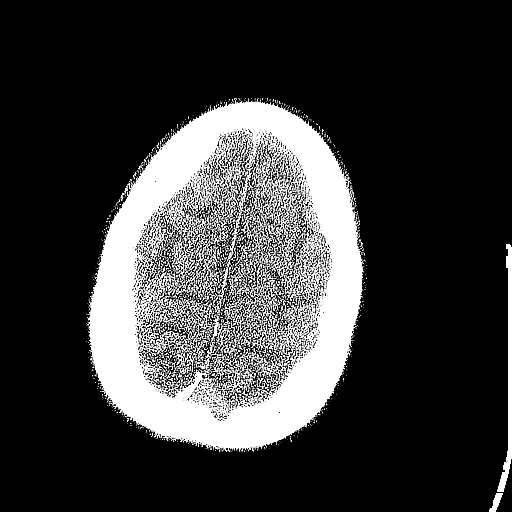
[im 73/82  brain]
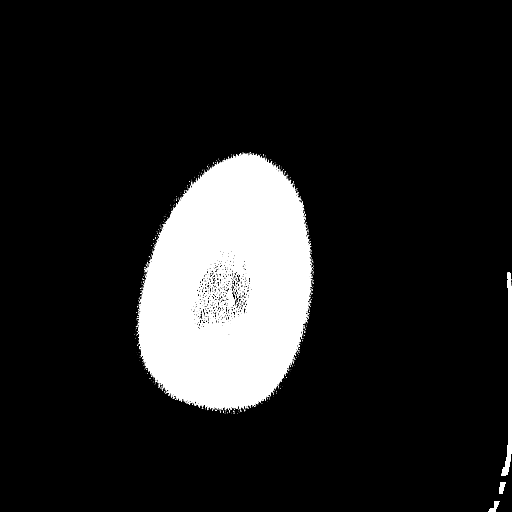
[im 73/82  bone]
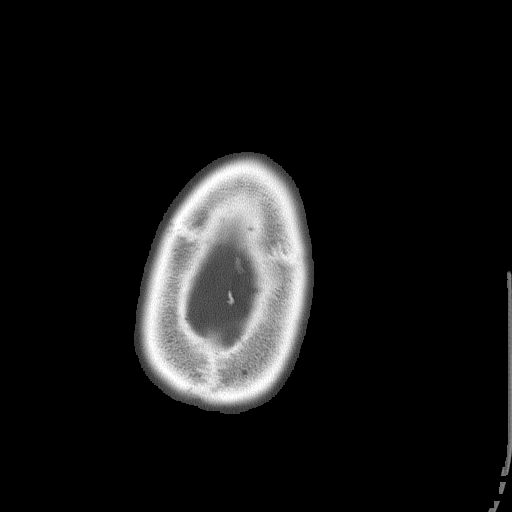

[Series 4: head 3.0 mpr cor · coronal · 0.32mm/px · 3 of 70 slices shown]
[im 24/70  brain]
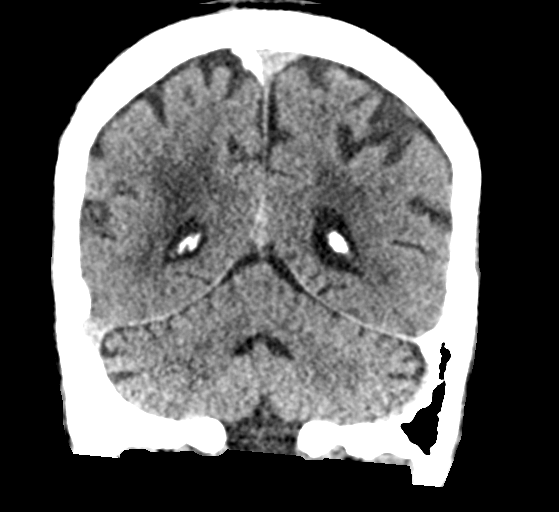
[im 31/70  brain]
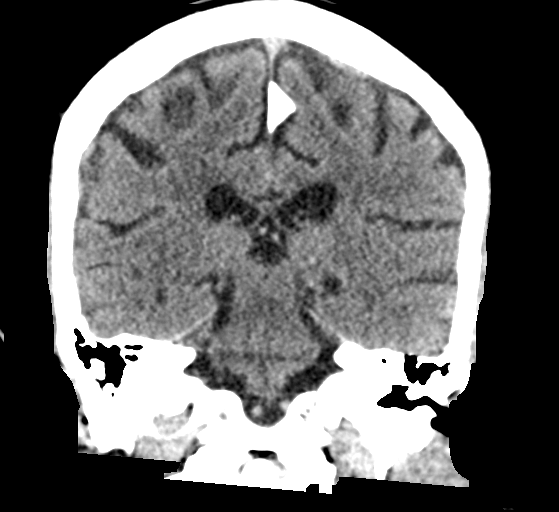
[im 39/70  brain]
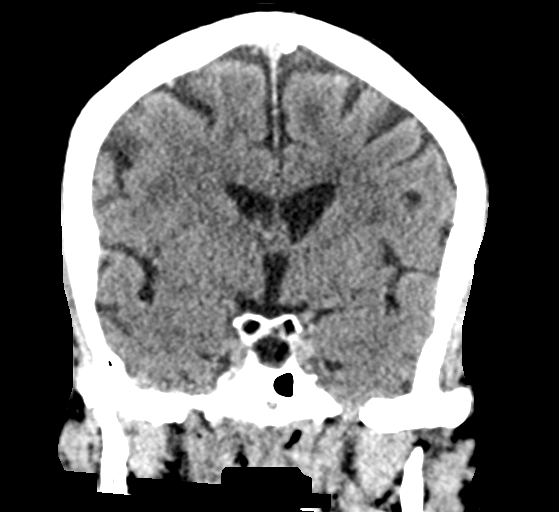

[Series 5: head 3.0 mpr sag · sagittal · 0.32mm/px · 3 of 60 slices shown]
[im 20/60  brain]
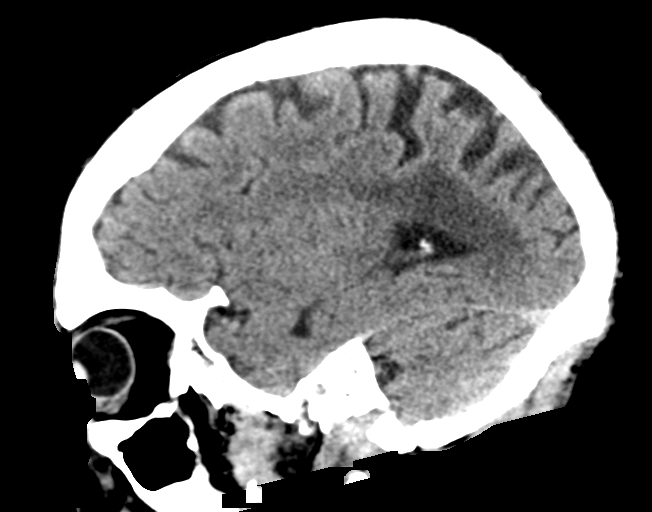
[im 30/60  brain]
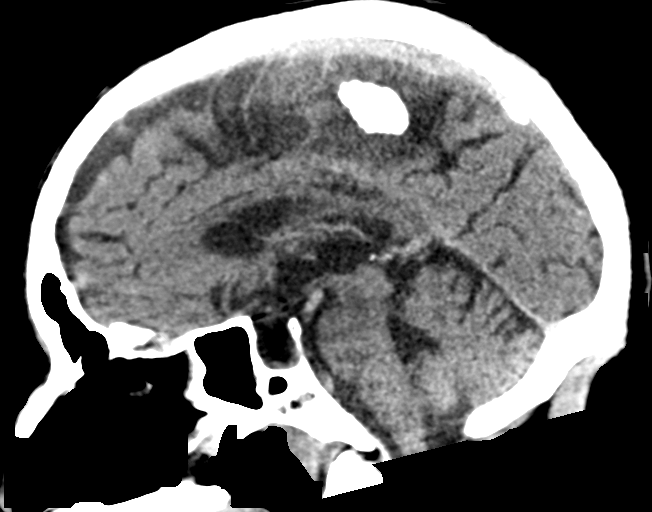
[im 40/60  brain]
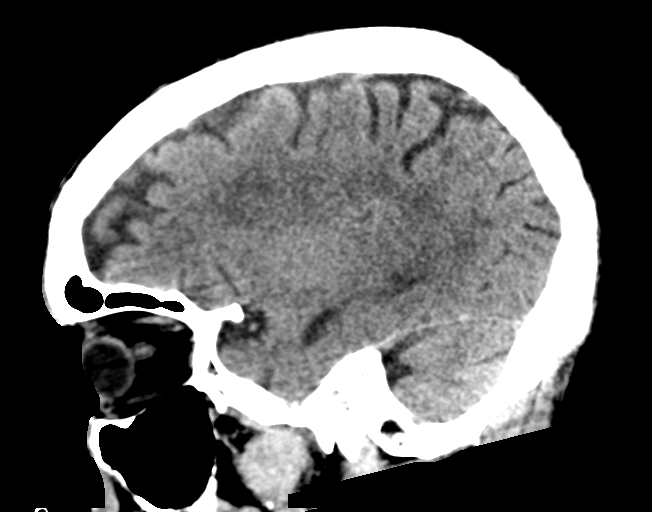

[15 of 47 positions shown; findings below may reference images not displayed]

FINDINGS: Brain: No acute intracranial hemorrhage, midline shift or mass
effect. No extra-axial fluid collection. Extensive periventricular
and subcortical white matter hypodensities are seen bilaterally. No
hydrocephalus.

Vascular: No hyperdense vessel or unexpected calcification.

Skull: Normal. Negative for fracture or focal lesion.

Sinuses/Orbits: Mild mucosal thickening is present in the ethmoid
air cells bilaterally and sphenoid sinuses. No acute orbital
abnormality.

Other: None.
IMPRESSION: 1. No acute intracranial process.
2. Extensive chronic microvascular ischemic changes.

## 2023-02-06 IMAGING — DX DG CHEST 1V PORT
1 series · 1 of 1 positions shown · non-contrast
Comparison: 01/30/2021.

CLINICAL DATA: Seizures.

EXAM:
PORTABLE CHEST 1 VIEW

[chest ap]
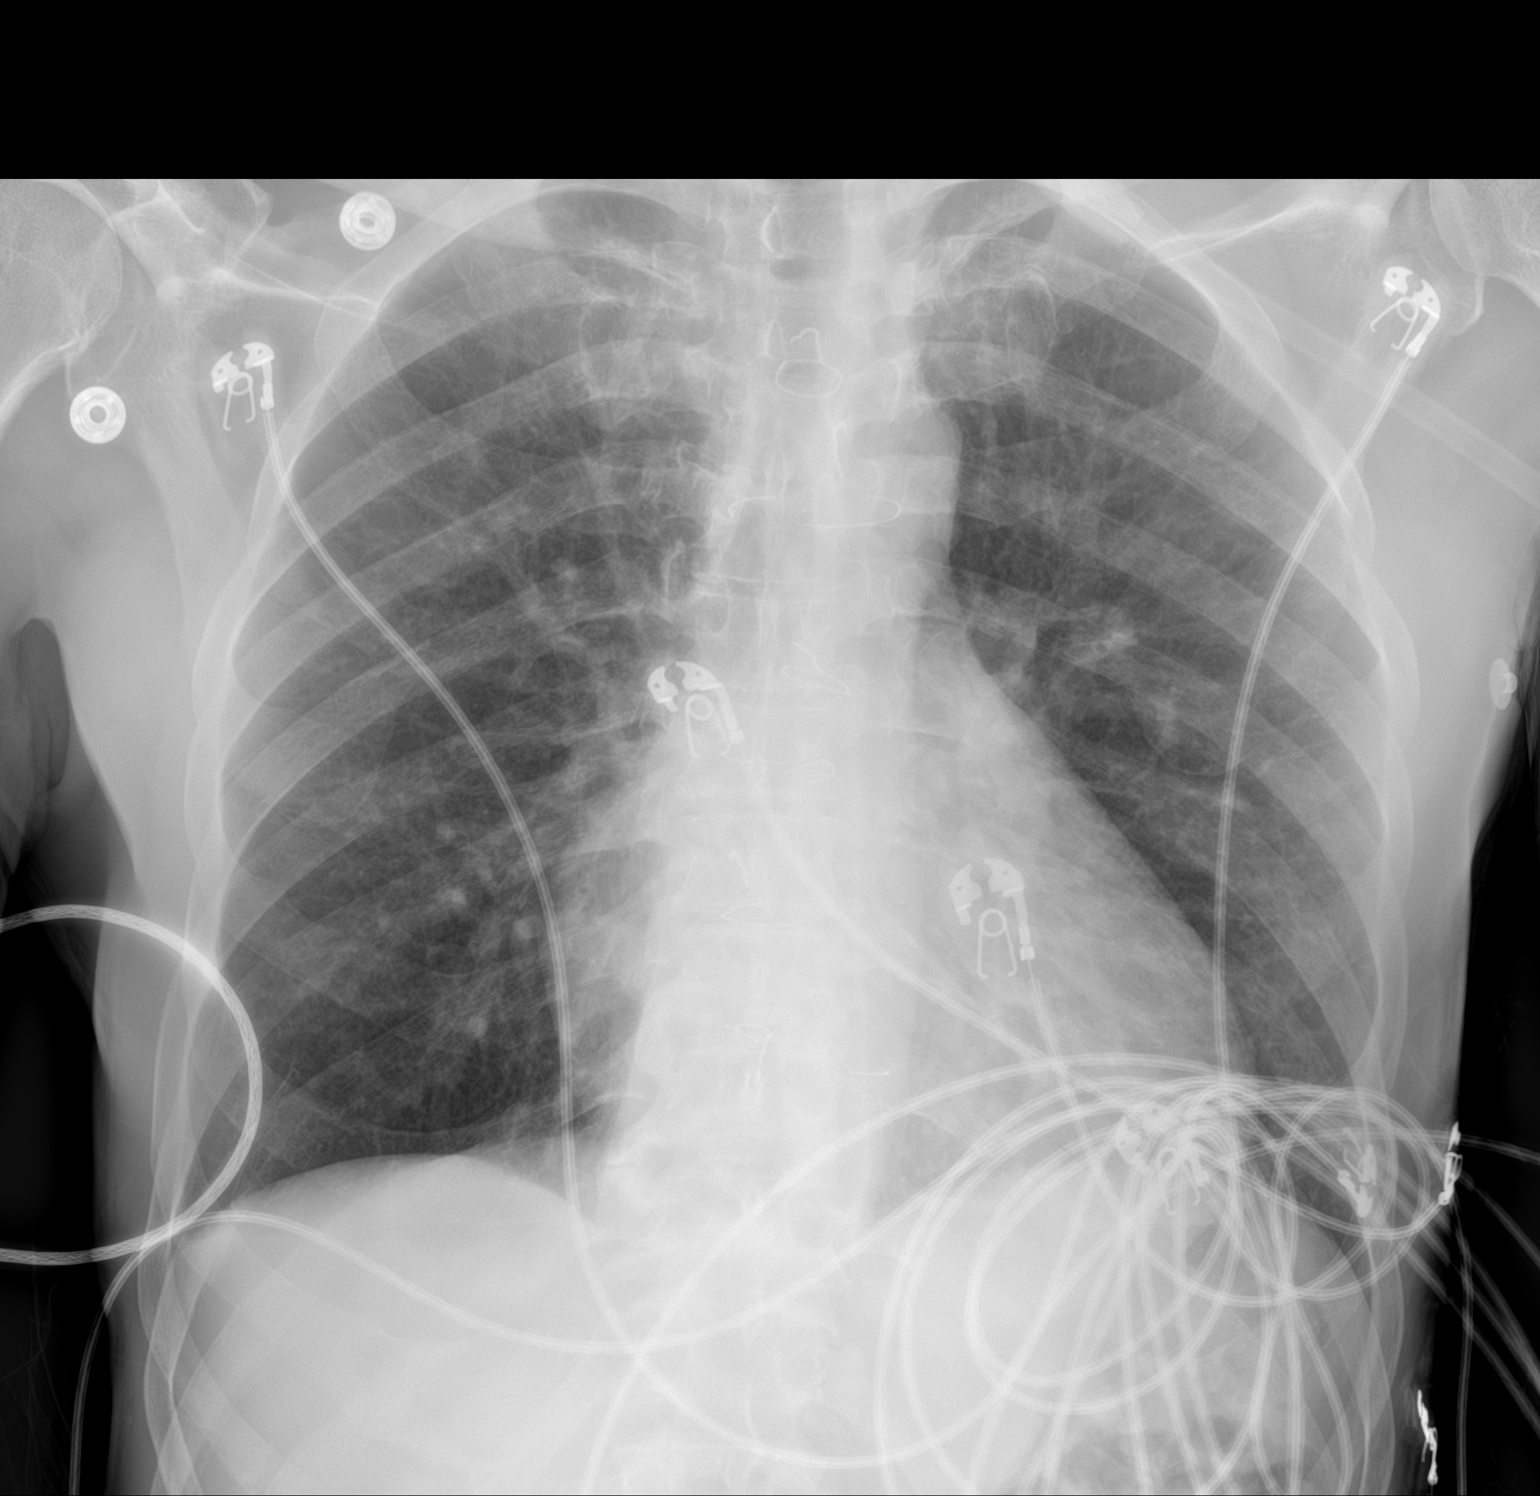

[1 of 1 positions shown; findings below may reference images not displayed]

FINDINGS: The heart is mildly enlarged and mediastinal contours are within
normal limits. No consolidation, effusion, or pneumothorax. No acute
osseous abnormality.
IMPRESSION: Mild cardiomegaly with no acute process.

## 2023-09-01 NOTE — Progress Notes (Unsigned)
Cardiology Office Note:  .   Date:  09/02/2023  ID:  Rodney Buchanan, DOB 16-Jul-1961, MRN [id number] PCP: Pcp, No  [facility] Providers Cardiologist:  None { History of Present Illness: Rodney Kitchen   Rodney Buchanan is a 62 yBuchanano. male with history of CVA, Etoh, tobacco abuse who presents for the evaluation of chest pain/abnormal EKG at the request of [facility], NP.   History of Present Illness   Rodney Buchanan, a 62 year old with a history of stroke, alcohol and tobacco abuse, and congenital heart disease, presents for evaluation of an abnormal EKG. He denies experiencing chest pain but reports infrequent sharp chest discomfort that is short-lived and not exertional. 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 He also reports cramping in his right leg, which occasionally causes him to stop and stretch. He has been a smoker since the age of 20 and admits to daily alcohol consumption, which he claims helps him sleep. He had a stroke approximately 16 years ago and underwent heart surgery at the age of [age] for a valve issue. He takes an 81mg  aspirin daily but does not take any blood pressure medication. His blood pressure fluctuates, and he does not monitor it at home. His wife reports BP medication was recommended but he has not started. Only on ASA. No family history of heart disease.          Problem List CVA -02/2009 Tobacco abuse  Etoh abuse  Congenital heart disease? HTN    ROS: All other ROS reviewed and negative. Pertinent positives noted in the HPI.     Studies Reviewed: Rodney Kitchen   EKG Interpretation Date/Time:  Monday September 02 2023 15:06:58 EST Ventricular Rate:  70 PR Interval:  174 QRS Duration:  82 QT Interval:  400 QTC Calculation: 432 R Axis:   43  Text Interpretation: Normal sinus rhythm with sinus arrhythmia Inferior infarct , age undetermined Confirmed by Lennie Odor [phone]) on 09/02/2023 3:11:45 PM   Physical  Exam:   VS:  BP (!) 160/98 (BP Location: Right Arm, Patient Position: Sitting, Cuff Size: Normal)   Pulse 70   Ht 5\' 9"  (1Buchanan753 m)   Wt 142 lb (64Buchanan4 kg)   SpO2 97%   BMI 20Buchanan97 kg/m    Wt Readings from Last 3 Encounters:  09/02/23 142 lb (64Buchanan4 kg)  05/15/22 140 lb (63Buchanan5 kg)  01/24/22 140 lb (63Buchanan5 kg)    GEN: Well nourished, well developed in no acute distress NECK: No JVD; No carotid bruits CARDIAC: RRR, no murmurs, rubs, gallops RESPIRATORY:  Clear to auscultation without rales, wheezing or rhonchi  ABDOMEN: Soft, non-tender, non-distended EXTREMITIES:  absent RLE pulses, 2+ BP pulse in LLE ASSESSMENT AND PLAN: .   Assessment and Plan    Abnormal EKG EKG shows inferior Q waves suggestive of old myocardial infarction. Patient denies history of myocardial infarction but reports a history of congenital heart disease with valve repair at age 84. -Order echocardiogram to further evaluate.  Claudication Reports cramping in right leg with absent pulses in right lower extremity suggestive of peripheral arterial disease. -Order lower extremity arterial duplex ultrasounds.  Hypertension Blood pressure elevated during visit. Patient does not currently take antihypertensive medication. -Start amlodipine 5mg  daily. -Advise patient to monitor blood pressure at home. -CMP, TSH, A1c, lipids today  Tobacco and Alcohol Use Daily smoker and alcohol use. Provided smoking cessation counseling. -Advise to quit smoking and limit alcohol use. -3 min smoking cessation counseling provided  today  General Health Maintenance -Order full panel of labs including CMP, TSH, lipids, and A1C. -Follow-up in 3-4 months to discuss results and further management.              Follow-up: Return in about 3 months (around 12/01/2023).   Signed, Lenna Gilford. Flora Lipps, MD, [facility]  [facility]  [address], [address] [place], [place] [address] [phone]  3:44 PM

## 2023-09-02 ENCOUNTER — Encounter: Payer: Self-pay | Admitting: Cardiovascular Disease

## 2023-09-02 ENCOUNTER — Ambulatory Visit: Payer: Commercial Managed Care - HMO | Attending: Cardiovascular Disease | Admitting: Cardiovascular Disease

## 2023-09-02 VITALS — BP 160/98 | HR 70 | Ht 69.0 in | Wt 142.0 lb

## 2023-09-02 DIAGNOSIS — I10 Essential (primary) hypertension: Secondary | ICD-10-CM | POA: Diagnosis not present

## 2023-09-02 DIAGNOSIS — I739 Peripheral vascular disease, unspecified: Secondary | ICD-10-CM

## 2023-09-02 DIAGNOSIS — R072 Precordial pain: Secondary | ICD-10-CM

## 2023-09-02 DIAGNOSIS — R9431 Abnormal electrocardiogram [ECG] [EKG]: Secondary | ICD-10-CM

## 2023-09-02 DIAGNOSIS — Z720 Tobacco use: Secondary | ICD-10-CM

## 2023-09-02 DIAGNOSIS — F17210 Nicotine dependence, cigarettes, uncomplicated: Secondary | ICD-10-CM

## 2023-09-02 NOTE — Patient Instructions (Addendum)
Medication Instructions:  Your physician recommends that you continue on your current medications as directed. Please refer to the Current Medication list given to you today.    *If you need a refill on your cardiac medications before your next appointment, please call your pharmacy*   Lab Work: CBC  HgA1C  TSH LIPIDS  CMET    If you have labs (blood work) drawn today and your tests are completely normal, you will receive your results only by: MyChart Message (if you have MyChart) OR A paper copy in the mail If you have any lab test that is abnormal or we need to change your treatment, we will call you to review the results.   Testing/Procedures: Echo will be scheduled at [address].  Your physician has requested that you have an echocardiogram. Echocardiography is a painless test that uses sound waves to create images of your heart. It provides your doctor with information about the size and shape of your heart and how well your heart's chambers and valves are working. This procedure takes approximately one hour. There are no restrictions for this procedure. Please do NOT wear cologne, perfume, aftershave, or lotions (deodorant is allowed). Please arrive 15 minutes prior to your appointment time.   Your physician has requested that you have a lower or upper extremity venous duplex. This test is an ultrasound of the veins in the legs or arms. It looks at venous blood flow that carries blood from the heart to the legs or arms. Allow one hour for a Lower Venous exam. Allow thirty minutes for an Upper Venous exam. There are no restrictions or special instructions.  Please note: We ask at that you not bring children with you during ultrasound (echo/ vascular) testing. Due to room size and safety concerns, children are not allowed in the ultrasound rooms during exams. Our front office staff cannot provide observation of children in our lobby area while testing is being  conducted. An adult accompanying a patient to their appointment will only be allowed in the ultrasound room at the discretion of the ultrasound technician under special circumstances. We apologize for any inconvenience.    Follow-Up: At [facility], you and your health needs are our priority.  As part of our continuing mission to provide you with exceptional heart care, we have created designated Provider Care Teams.  These Care Teams include your primary Cardiologist (physician) and Advanced Practice Providers (APPs -  Physician Assistants and Nurse Practitioners) who all work together to provide you with the care you need, when you need it.  We recommend signing up for the patient portal called "MyChart".  Sign up information is provided on this After Visit Summary.  MyChart is used to connect with patients for Virtual Visits (Telemedicine).  Patients are able to view lab/test results, encounter notes, upcoming appointments, etc.  Non-urgent messages can be sent to your provider as well.   To learn more about what you can do with MyChart, go to [email or web address].    Your next appointment:   3-4 month(s)  The format for your next appointment:   In Person  Provider:   Lennie Odor, MD    Other Instructions

## 2023-09-03 ENCOUNTER — Other Ambulatory Visit: Payer: Self-pay

## 2023-09-03 LAB — COMPREHENSIVE METABOLIC PANEL
ALT: 36 [IU]/L (ref 0–44)
AST: 52 [IU]/L — ABNORMAL HIGH (ref 0–40)
Albumin: 4.5 g/dL (ref 3.9–4.9)
Alkaline Phosphatase: 136 [IU]/L — ABNORMAL HIGH (ref 44–121)
BUN/Creatinine Ratio: 10 (ref 10–24)
BUN: 10 mg/dL (ref 8–27)
Bilirubin Total: 2.2 mg/dL — ABNORMAL HIGH (ref 0.0–1.2)
CO2: 24 mmol/L (ref 20–29)
Calcium: 9.5 mg/dL (ref 8.6–10.2)
Chloride: 103 mmol/L (ref 96–106)
Creatinine, Ser: 0.98 mg/dL (ref 0.76–1.27)
Globulin, Total: 2.6 g/dL (ref 1.5–4.5)
Glucose: 108 mg/dL — ABNORMAL HIGH (ref 70–99)
Potassium: 4.5 mmol/L (ref 3.5–5.2)
Sodium: 143 mmol/L (ref 134–144)
Total Protein: 7.1 g/dL (ref 6.0–8.5)
eGFR: 86 mL/min/{1.73_m2} (ref >59)

## 2023-09-03 LAB — LIPID PANEL
Chol/HDL Ratio: 2.6 {ratio} (ref 0.0–5.0)
Cholesterol, Total: 177 mg/dL (ref 100–199)
HDL: 67 mg/dL (ref >39)
LDL Chol Calc (NIH): 97 mg/dL (ref 0–99)
Triglycerides: 70 mg/dL (ref 0–149)
VLDL Cholesterol Cal: 13 mg/dL (ref 5–40)

## 2023-09-03 LAB — CBC
Hematocrit: 44.3 % (ref 37.5–51.0)
Hemoglobin: 15.6 g/dL (ref 13.0–17.7)
MCH: 36.2 pg — ABNORMAL HIGH (ref 26.6–33.0)
MCHC: 35.2 g/dL (ref 31.5–35.7)
MCV: 103 fL — ABNORMAL HIGH (ref 79–97)
Platelets: 202 10*3/uL (ref 150–450)
RBC: 4.31 x10E6/uL (ref 4.14–5.80)
RDW: 11.8 % (ref 11.6–15.4)
WBC: 3.6 10*3/uL (ref 3.4–10.8)

## 2023-09-03 LAB — TSH: TSH: 0.839 u[IU]/mL (ref 0.450–4.500)

## 2023-09-03 LAB — HEMOGLOBIN A1C
Est. average glucose Bld gHb Est-mCnc: 97 mg/dL
Hgb A1c MFr Bld: 5 % (ref 4.8–5.6)

## 2023-09-03 MED ORDER — ATORVASTATIN CALCIUM 20 MG PO TABS
20.0000 mg | ORAL_TABLET | Freq: Every day | ORAL | 3 refills | Status: DC
Start: 2023-09-03 — End: 2024-03-25

## 2023-09-23 ENCOUNTER — Telehealth: Payer: Self-pay | Admitting: Cardiovascular Disease

## 2023-09-23 MED ORDER — AMLODIPINE BESYLATE 5 MG PO TABS: 5.0000 mg | ORAL_TABLET | Freq: Every day | ORAL | 3 refills | Status: DC

## 2023-09-23 NOTE — Telephone Encounter (Signed)
 Patient identification verified by 2 forms. Rodney Cooks, RN     Called and spoke to patient  Patient states:   -would like to know about starting BP medication  Informed patient: -per 09/02/23 OV, plan was to start amlodipine  5mg  daily  -Rx sent to preferred pharmacy  -advised patient to monitor BP at home, check 1 hour after taking BP medication  Reviewed ED warning signs/precautions  Patient verbalized understanding, no questions at this time

## 2023-09-23 NOTE — Telephone Encounter (Signed)
 Patient is calling to get clarification on orders that were suppose to be sent to his pharmacy for BP readings. Requesting call back to discuss further.

## 2023-09-24 ENCOUNTER — Telehealth: Payer: Self-pay | Admitting: Cardiovascular Disease

## 2023-09-24 ENCOUNTER — Other Ambulatory Visit: Payer: Self-pay

## 2023-09-24 MED ORDER — AMLODIPINE BESYLATE 5 MG PO TABS: 5.0000 mg | ORAL_TABLET | Freq: Every day | ORAL | 3 refills | Status: DC

## 2023-09-24 NOTE — Telephone Encounter (Signed)
 Pt's medication was sent to pt's pharmacy as requested. Confirmation received.

## 2023-09-24 NOTE — Telephone Encounter (Signed)
*  STAT* If patient is at the pharmacy, call can be transferred to refill team.   1. Which medications need to be refilled? (please list name of each medication and dose if known) amLODipine  (NORVASC ) 5 MG tablet    2. Would you like to learn more about the convenience, safety, & potential cost savings by using the [facility]?      3. Are you open to using the [facility] (Type [facility]. ).   4. Which pharmacy/location (including street and city if local pharmacy) is medication to be sent to? [id number] - [place], [place] - [address] AT [address]    5. Do they need a 30 day or 90 day supply? 90

## 2023-10-01 ENCOUNTER — Other Ambulatory Visit (HOSPITAL_COMMUNITY): Payer: Self-pay | Admitting: Cardiovascular Disease

## 2023-10-01 DIAGNOSIS — I739 Peripheral vascular disease, unspecified: Secondary | ICD-10-CM

## 2023-10-07 ENCOUNTER — Institutional Professional Consult (permissible substitution): Payer: Commercial Managed Care - HMO | Admitting: Pulmonary Disease

## 2023-10-09 ENCOUNTER — Ambulatory Visit (HOSPITAL_COMMUNITY): Payer: Self-pay

## 2023-10-09 ENCOUNTER — Ambulatory Visit (HOSPITAL_COMMUNITY): Admission: RE | Admit: 2023-10-09 | Payer: Self-pay | Source: Ambulatory Visit

## 2023-10-14 DIAGNOSIS — C61 Malignant neoplasm of prostate: Secondary | ICD-10-CM | POA: Diagnosis not present

## 2023-10-14 DIAGNOSIS — R350 Frequency of micturition: Secondary | ICD-10-CM | POA: Diagnosis not present

## 2023-10-14 DIAGNOSIS — R310 Gross hematuria: Secondary | ICD-10-CM | POA: Diagnosis not present

## 2023-10-14 DIAGNOSIS — N401 Enlarged prostate with lower urinary tract symptoms: Secondary | ICD-10-CM | POA: Diagnosis not present

## 2023-11-12 ENCOUNTER — Ambulatory Visit (HOSPITAL_BASED_OUTPATIENT_CLINIC_OR_DEPARTMENT_OTHER)
Admission: RE | Admit: 2023-11-12 | Discharge: 2023-11-12 | Disposition: A | Discharge status: Home / Self Care | Payer: 59 | Source: Ambulatory Visit | Attending: Cardiovascular Disease | Admitting: Cardiovascular Disease

## 2023-11-12 ENCOUNTER — Ambulatory Visit (HOSPITAL_COMMUNITY)
Admission: RE | Admit: 2023-11-12 | Discharge: 2023-11-12 | Disposition: A | Discharge status: Home / Self Care | Payer: 59 | Source: Ambulatory Visit | Attending: Cardiovascular Disease | Admitting: Cardiovascular Disease

## 2023-11-12 DIAGNOSIS — I739 Peripheral vascular disease, unspecified: Secondary | ICD-10-CM | POA: Diagnosis not present

## 2023-11-12 DIAGNOSIS — R9431 Abnormal electrocardiogram [ECG] [EKG]: Secondary | ICD-10-CM | POA: Diagnosis not present

## 2023-11-12 DIAGNOSIS — I10 Essential (primary) hypertension: Secondary | ICD-10-CM | POA: Diagnosis not present

## 2023-11-12 LAB — ECHOCARDIOGRAM COMPLETE
AR max vel: 1.9 cm2
AV Area VTI: 1.88 cm2
AV Area mean vel: 1.96 cm2
AV Mean grad: 3 mm[Hg]
AV Peak grad: 6.5 mm[Hg]
Ao pk vel: 1.27 m/s
Area-P 1/2: 3.1 cm2
S' Lateral: 3.38 cm
Single Plane A4C EF: 53.2 %

## 2023-11-14 ENCOUNTER — Telehealth: Payer: Self-pay | Admitting: Cardiovascular Disease

## 2023-11-14 NOTE — Telephone Encounter (Signed)
 Patient identification verified by 2 forms. Shade Flood, RN     Patient called for ABI and ECHO results. Informed patient results have been released to mychart but have not been reviewed by primary cardiologist yet.   Patient verbalized understanding. No further questions at this time.

## 2023-11-14 NOTE — Telephone Encounter (Signed)
Patient called to follow-up on test results. 

## 2023-11-17 LAB — VAS US ABI WITH/WO TBI
Left ABI: 1.05
Right ABI: 1.13

## 2023-11-20 DIAGNOSIS — N401 Enlarged prostate with lower urinary tract symptoms: Secondary | ICD-10-CM | POA: Diagnosis not present

## 2023-11-20 DIAGNOSIS — N5201 Erectile dysfunction due to arterial insufficiency: Secondary | ICD-10-CM | POA: Diagnosis not present

## 2023-11-20 DIAGNOSIS — R351 Nocturia: Secondary | ICD-10-CM | POA: Diagnosis not present

## 2023-11-20 DIAGNOSIS — C61 Malignant neoplasm of prostate: Secondary | ICD-10-CM | POA: Diagnosis not present

## 2023-11-21 ENCOUNTER — Telehealth: Payer: Self-pay | Admitting: Cardiovascular Disease

## 2023-11-21 NOTE — Telephone Encounter (Signed)
 Patient is requesting call back in regards to results. Please advise.

## 2023-11-21 NOTE — Telephone Encounter (Signed)
 Called patient. Patient made aware of the ABI and ECHO results. Verbalized understanding. No questions or concerns expressed at this time.

## 2023-11-22 ENCOUNTER — Other Ambulatory Visit (HOSPITAL_COMMUNITY): Payer: Self-pay | Admitting: Urology

## 2023-11-22 DIAGNOSIS — R972 Elevated prostate specific antigen [PSA]: Secondary | ICD-10-CM

## 2023-11-22 DIAGNOSIS — C61 Malignant neoplasm of prostate: Secondary | ICD-10-CM

## 2023-11-29 ENCOUNTER — Other Ambulatory Visit (HOSPITAL_COMMUNITY): Payer: Self-pay | Admitting: Urology

## 2023-11-29 DIAGNOSIS — C61 Malignant neoplasm of prostate: Secondary | ICD-10-CM

## 2023-11-29 DIAGNOSIS — R972 Elevated prostate specific antigen [PSA]: Secondary | ICD-10-CM

## 2023-12-05 ENCOUNTER — Ambulatory Visit (HOSPITAL_COMMUNITY)
Admission: RE | Admit: 2023-12-05 | Discharge: 2023-12-05 | Disposition: A | Discharge status: Home / Self Care | Source: Ambulatory Visit | Attending: Urology | Admitting: Urology

## 2023-12-05 ENCOUNTER — Encounter (HOSPITAL_COMMUNITY)
Admission: RE | Admit: 2023-12-05 | Discharge: 2023-12-05 | Disposition: A | Discharge status: Home / Self Care | Source: Ambulatory Visit | Attending: Urology | Admitting: Urology

## 2023-12-05 DIAGNOSIS — K4090 Unilateral inguinal hernia, without obstruction or gangrene, not specified as recurrent: Secondary | ICD-10-CM | POA: Diagnosis not present

## 2023-12-05 DIAGNOSIS — C61 Malignant neoplasm of prostate: Secondary | ICD-10-CM

## 2023-12-05 DIAGNOSIS — R972 Elevated prostate specific antigen [PSA]: Secondary | ICD-10-CM

## 2023-12-05 MED ORDER — IOHEXOL 300 MG/ML  SOLN
100.0000 mL | Freq: Once | INTRAMUSCULAR | Status: AC | PRN
  Administered 2023-12-05: 100 mL via INTRAVENOUS

## 2023-12-05 MED ORDER — TECHNETIUM TC 99M MEDRONATE IV KIT
20.0000 | PACK | Freq: Once | INTRAVENOUS | Status: AC | PRN
  Administered 2023-12-05: 20.7 via INTRAVENOUS

## 2023-12-12 ENCOUNTER — Other Ambulatory Visit: Payer: Self-pay | Admitting: Cardiovascular Disease

## 2023-12-16 NOTE — Progress Notes (Deleted)
  Cardiology Office Note:  .   Date:  12/16/2023  ID:  Rodney Buchanan, DOB Jan 17, 1961, MRN [id number] PCP: Pcp, No  [facility] Providers Cardiologist:  None { Click to update primary MD,subspecialty MD or APP then REFRESH:1}   History of Present Illness: .   No chief complaint on file.   Rodney Buchanan is a 63 y.o. male with history of HTN, tobacco abuse who presents for follow-up.      Problem List CVA -02/2009 Tobacco abuse  Etoh abuse  Congenital heart disease? HTN HLD -T chol 177, HDL 67, LDL 97, TG 70    ROS: All other ROS reviewed and negative. Pertinent positives noted in the HPI.     Studies Reviewed: Marland Kitchen        ABI 11/17/2023 Summary:  Right: Resting right ankle-brachial index is within normal range. The  right toe-brachial index is abnormal.   Right ABI 1.13 with normal triphasic waveform at the ankle.  Left: Resting left ankle-brachial index is within normal range. The left  toe-brachial index is normal.   TTE 11/12/2023  1. Prominent trabeculation noted, especially at apex, with mild global  hypokinesis with more prominent apical hypokinesis. Left ventricular  ejection fraction, by estimation, is 50 to 55%. The left ventricle has low  normal function. The left ventricle  demonstrates global hypokinesis. Left ventricular diastolic parameters  were normal.   2. Right ventricular systolic function is mildly reduced. The right  ventricular size is mildly enlarged.   3. Left atrial size was moderately dilated.   4. Right atrial size was moderately dilated.   5. The mitral valve is normal in structure. Trivial mitral valve  regurgitation. No evidence of mitral stenosis.   6. The tricuspid valve is abnormal.   7. The aortic valve is tricuspid. There is mild calcification of the  aortic valve. Aortic valve regurgitation is not visualized. No aortic  stenosis is present.   8. Mild pulmonic stenosis.   9. The inferior vena cava is dilated in size with  >50% respiratory  variability, suggesting right atrial pressure of 8 mmHg.  10. Evidence of atrial level shunting detected by color flow Doppler.  There is a small patent foramen ovale with predominantly left to right  shunting across the atrial septum.  Physical Exam:   VS:  There were no vitals taken for this visit.   Wt Readings from Last 3 Encounters:  09/02/23 142 lb (64.4 kg)  05/15/22 140 lb (63.5 kg)  01/24/22 140 lb (63.5 kg)    GEN: Well nourished, well developed in no acute distress NECK: No JVD; No carotid bruits CARDIAC: ***RRR, no murmurs, rubs, gallops RESPIRATORY:  Clear to auscultation without rales, wheezing or rhonchi  ABDOMEN: Soft, non-tender, non-distended EXTREMITIES:  No edema; No deformity  ASSESSMENT AND PLAN: .   ***    {Are you ordering a CV Procedure (e.g. stress test, cath, DCCV, TEE, etc)?   Press F2        :[id number]}   Follow-up: No follow-ups on file.  Time Spent with Patient: I have spent a total of *** minutes caring for this patient today face to face, ordering and reviewing labs/tests, reviewing prior records/medical history, examining the patient, establishing an assessment and plan, communicating results/findings to the patient/family, and documenting in the medical record.   Signed, Lenna Gilford. Flora Lipps, MD, [facility]  [facility]  [address], [address] [place], [place] [address] [phone]  8:54 PM

## 2023-12-18 ENCOUNTER — Ambulatory Visit: Payer: Commercial Managed Care - HMO | Attending: Cardiovascular Disease | Admitting: Cardiovascular Disease

## 2023-12-18 DIAGNOSIS — I10 Essential (primary) hypertension: Secondary | ICD-10-CM

## 2023-12-18 DIAGNOSIS — Z720 Tobacco use: Secondary | ICD-10-CM

## 2023-12-18 DIAGNOSIS — R072 Precordial pain: Secondary | ICD-10-CM

## 2024-01-02 ENCOUNTER — Other Ambulatory Visit: Payer: Self-pay | Admitting: Urology

## 2024-01-02 ENCOUNTER — Telehealth: Payer: Self-pay

## 2024-01-02 ENCOUNTER — Telehealth: Payer: Self-pay | Admitting: Cardiovascular Disease

## 2024-01-02 NOTE — Telephone Encounter (Signed)
  Patient Consent for Virtual Visit        Rodney Buchanan has provided verbal consent on 01/02/2024 for a virtual visit (video or telephone).   CONSENT FOR VIRTUAL VISIT FOR:  Rodney Buchanan  By participating in this virtual visit I agree to the following:  I hereby voluntarily request, consent and authorize [facility] and its employed or contracted physicians, physician assistants, nurse practitioners or other licensed health care professionals (the Practitioner), to provide me with telemedicine health care services (the "Services") as deemed necessary by the treating Practitioner. I acknowledge and consent to receive the Services by the Practitioner via telemedicine. I understand that the telemedicine visit will involve communicating with the Practitioner through live audiovisual communication technology and the disclosure of certain medical information by electronic transmission. I acknowledge that I have been given the opportunity to request an in-person assessment or other available alternative prior to the telemedicine visit and am voluntarily participating in the telemedicine visit.  I understand that I have the right to withhold or withdraw my consent to the use of telemedicine in the course of my care at any time, without affecting my right to future care or treatment, and that the Practitioner or I may terminate the telemedicine visit at any time. I understand that I have the right to inspect all information obtained and/or recorded in the course of the telemedicine visit and may receive copies of available information for a reasonable fee.  I understand that some of the potential risks of receiving the Services via telemedicine include:  Delay or interruption in medical evaluation due to technological equipment failure or disruption; Information transmitted may not be sufficient (e.g. poor resolution of images) to allow for appropriate medical decision making by the  Practitioner; and/or  In rare instances, security protocols could fail, causing a breach of personal health information.  Furthermore, I acknowledge that it is my responsibility to provide information about my medical history, conditions and care that is complete and accurate to the best of my ability. I acknowledge that Practitioner's advice, recommendations, and/or decision may be based on factors not within their control, such as incomplete or inaccurate data provided by me or distortions of diagnostic images or specimens that may result from electronic transmissions. I understand that the practice of medicine is not an exact science and that Practitioner makes no warranties or guarantees regarding treatment outcomes. I acknowledge that a copy of this consent can be made available to me via my patient portal [facility] MyChart), or I can request a printed copy by calling the office of [facility].    I understand that my insurance will be billed for this visit.   I have read or had this consent read to me. I understand the contents of this consent, which adequately explains the benefits and risks of the Services being provided via telemedicine.  I have been provided ample opportunity to ask questions regarding this consent and the Services and have had my questions answered to my satisfaction. I give my informed consent for the services to be provided through the use of telemedicine in my medical care

## 2024-01-02 NOTE — Telephone Encounter (Signed)
 Preop televisit scheduled, med rec and consent done

## 2024-01-02 NOTE — Telephone Encounter (Signed)
   Name: Rodney Buchanan  DOB: 08-30-1961  MRN: [id number]  Primary Cardiologist: None   Preoperative team, please contact this patient and set up a phone call appointment for further preoperative risk assessment. Please obtain consent and complete medication review. Thank you for your help.  I confirm that guidance regarding antiplatelet and oral anticoagulation therapy has been completed and, if necessary, noted below.  ASA is not managed by cardiology and guidance will come from prescribing provider.  I also confirmed the patient resides in the state of [place] . As per [facility] Medical Board telemedicine laws, the patient must reside in the state in which the provider is licensed.   Francene Ing, Retha Cast, NP 01/02/2024, 3:33 PM [facility]

## 2024-01-02 NOTE — Telephone Encounter (Signed)
   Pre-operative Risk Assessment    Patient Name: Rodney Buchanan  DOB: [birth year] MRN: [id number]   Date of last office visit: 09/02/2023 Date of next office visit: TBD   Request for Surgical Clearance    Procedure:   robotic prostatectomy node de section and left inguinal hernia repair  Date of Surgery:  Clearance 03/25/24                                Surgeon:  Dr. Floreen Hunger Group or Practice Name:  [facility]  Phone number:  [phone] Ext: [phone] Fax number:  [phone]   Type of Clearance Requested:   - Medical  - Pharmacy:  Hold Aspirin 5 day prior    Type of Anesthesia:  General    Additional requests/questions:    Audley Bleak   01/02/2024, 3:18 PM

## 2024-03-11 ENCOUNTER — Ambulatory Visit: Attending: Cardiology

## 2024-03-11 DIAGNOSIS — Z01810 Encounter for preprocedural cardiovascular examination: Secondary | ICD-10-CM

## 2024-03-11 NOTE — Progress Notes (Signed)
   Name:  Rodney Buchanan  DOB:  Aug 26, 1961  MRN:  [id number]   Primary Cardiologist: None  Chart reviewed as part of pre-operative protocol coverage. Patient was contacted 03/11/2024 in reference to pre-operative risk assessment for pending surgery as outlined below.  Rodney Buchanan was last seen on 09/02/2023 by Dr. Barbaraann.  Patient was to have follow-up in April however did not present for appointment.  Today patient notes concerns regarding medications and his prior testing, requests an in-office appointment for preoperative cardiac evaluation.  Pre-op covering staff: - Please schedule appointment and call patient to inform them. If patient already had an upcoming appointment within acceptable timeframe, please add pre-op clearance to the appointment notes so provider is aware. - Please contact requesting surgeon's office via preferred method (i.e, phone, fax) to inform them of need for appointment prior to surgery.  Nahara Dona D Jacole Capley, NP 03/11/2024, 11:39 AM

## 2024-03-17 NOTE — Progress Notes (Deleted)
 Cardiology Clinic Note   Patient Name: Rodney Buchanan Date of Encounter: 03/17/2024  Primary Care Provider:  Pcp, No Primary Cardiologist:  None  Patient Profile    Rodney Buchanan 63 year old male presents to the clinic today for follow-up evaluation of his precordial pain and preoperative cardiac evaluation.  Past Medical History    Past Medical History:  Diagnosis Date   ETOH abuse    Stroke (HCC)    x7   Past Surgical History:  Procedure Laterality Date   CARDIAC SURGERY     Murmur Repair    Allergies  No Known Allergies  History of Present Illness    Rodney Buchanan is a PMH of CVA, EtOH, tobacco abuse, congenital heart disease, and chest pain.  He was initially referred by his PCP for evaluation due to abnormal EKG and chest pain.  He was seen by Dr. Barbaraann 09/02/2023.  He described infrequent sharp chest discomfort that was short-lived and nonexertional.  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  He reported cramping in his right leg which occasionally caused him to stop and stretch.  He had been a smoker since age 19 and admitted to daily alcohol consumption.  He reported that this helped him sleep.  He had a stroke 16 years prior and underwent heart surgery at the age of 5 for a valvular issue.  He reported taking aspirin daily.  He denied taking blood pressure medication.  He noted that his blood pressure would fluctuate.  He did not monitor this at home.  His wife noted that he had been prescribed blood pressure medication but he had not yet started it.  He denied family history of heart disease.  He presents to the clinic today for follow-up evaluation and preoperative cardiac evaluation.  He states***.  *** denies chest pain, shortness of breath, lower extremity edema, fatigue, palpitations, melena, hematuria, hemoptysis, diaphoresis, weakness, presyncope, syncope, orthopnea, and PND.  Precordial pain-denies exertional chest  discomfort.  Occasionally has episodes of sharp brief chest pain.  Echocardiogram reassuring with normal EF. Heart healthy low-sodium diet Maintain physical activity No plans for ischemic evaluation  Essential hypertension-BP today***. Maintain blood pressure log Heart healthy low-sodium diet Maintain physical activity Continue amlodipine   Claudication-Notes occasional episodes of lower extremity claudication.  These are brief and resolved with stretching.  They are intermittent and may happen once per***.  Lower extremity ABIs were negative.  11/12/2023.  I suspect this is related to musculoskeletal issues. Maintain p.o. hydration  History of CVA-neurologically intact.  CVA 6/10. Follows with PCP  Preoperative cardiac evaluation-Procedure:  robotic prostatectomy node de section and left inguinal hernia repair   Date of Surgery:  Clearance 03/25/24                                  Surgeon:  Dr. Alvaro Socks Group or Practice Name:  [facility]  Phone number:  [phone] Ext: [phone] Fax number:  [phone]      Primary Cardiologist: Dr. Barbaraann  Chart reviewed as part of pre-operative protocol coverage. Given past medical history and time since last visit, based on ACC/AHA guidelines, Rodney Buchanan would be at acceptable risk for the planned procedure without further cardiovascular testing.   His RCRI is moderate risk, 6.6% risk of major cardiac event.  He is able to complete greater than 4 METS of physical activity.  Patient was advised that  if he develops new symptoms prior to surgery to contact our office to arrange a follow-up appointment.  He verbalized understanding.  Patient's aspirin is not managed by cardiology.  Recommendations for holding aspirin will need to come from prescribing provider.  I will route this recommendation to the requesting party via Epic fax function and remove from pre-op pool.   Disposition: Follow-up with Dr. Barbaraann or me in 9-12  months.    Home Medications    Prior to Admission medications   Medication Sig Start Date End Date Taking? Authorizing Provider  amLODipine  (NORVASC ) 5 MG tablet TAKE 1 TABLET BY MOUTH DAILY 12/12/23   O'Neal, Darryle Ned, MD  aspirin EC 81 MG tablet Take 81 mg by mouth daily. Swallow whole.    [provider]  atorvastatin  (LIPITOR) 20 MG tablet Take 1 tablet (20 mg total) by mouth daily. 09/03/23   O'NealDarryle Ned, MD  levofloxacin (LEVAQUIN) 750 MG tablet Take 750 mg by mouth daily. 08/31/23   [provider]  solifenacin (VESICARE) 5 MG tablet Take 5 mg by mouth daily. 08/31/23   [provider]    Family History    Family History  Problem Relation Age of Onset   Stroke Mother    He indicated that his mother is deceased. He indicated that his father is deceased.  Social History    Social History   Socioeconomic History   Marital status: Married    Spouse name: Not on file   Number of children: 2   Years of education: Not on file   Highest education level: Not on file  Occupational History   Not on file  Tobacco Use   Smoking status: Every Day    Current packs/day: 1.00    Average packs/day: 1 pack/day for 53.5 years (53.5 ttl pk-yrs)    Types: Cigarettes    Start date: 09/01/1970   Smokeless tobacco: Never  Vaping Use   Vaping status: Never Used  Substance and Sexual Activity   Alcohol use: Yes    Alcohol/week: 28.0 standard drinks of alcohol    Types: 28 Shots of liquor per week   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  Cardiovascular:  No chest pain, dyspnea on exertion, edema, orthopnea, palpitations, paroxysmal nocturnal dyspnea. Dermatological: No rash, lesions/masses Respiratory: No cough, dyspnea Urologic: No hematuria, dysuria Abdominal:   No nausea, vomiting, diarrhea, bright red blood per rectum, melena, or hematemesis Neurologic:  No visual changes, wkns, changes in mental status. All other systems reviewed and are otherwise negative except as noted above.  Physical Exam    VS:  There were no vitals taken for this visit. , BMI There is no height or weight on file to calculate BMI. GEN: Well nourished, well developed, in no acute distress. HEENT: normal. Neck: Supple, no JVD, carotid bruits, or masses. Cardiac: RRR, no murmurs, rubs, or gallops. No clubbing, cyanosis, edema.  Radials/DP/PT 2+  and equal bilaterally.  Respiratory:  Respirations regular and unlabored, clear to auscultation bilaterally. GI: Soft, nontender, nondistended, BS + x 4. MS: no deformity or atrophy. Skin: warm and dry, no rash. Neuro:  Strength and sensation are intact. Psych: Normal affect.  Accessory Clinical Findings    Recent Labs: 09/02/2023: ALT 36; BUN 10; Creatinine, Ser 0.98; Hemoglobin 15.6; Platelets 202; Potassium 4.5; Sodium 143; TSH 0.839   Recent Lipid Panel    Component Value Date/Time   CHOL 177 09/02/2023 1537   TRIG 70 09/02/2023 1537   HDL 67 09/02/2023 1537   CHOLHDL 2.6 09/02/2023 1537   CHOLHDL 4.3 03/09/2009 0440   VLDL 36 03/09/2009 0440   LDLCALC 97 09/02/2023 1537    No BP recorded.  {Refresh Note OR Click here to enter BP  :1}***    ECG personally reviewed by me today- ***     Echocardiogram 11/12/2023  IMPRESSIONS   1. Prominent trabeculation noted, especially at apex, with mild global hypokinesis with more prominent apical hypokinesis. Left ventricular ejection fraction, by estimation, is 50 to 55%. The left  ventricle has low normal function. The left ventricle demonstrates global hypokinesis. Left ventricular diastolic parameters were normal. 2. Right ventricular systolic function is mildly reduced. The right ventricular size is mildly enlarged. 3. Left atrial size was moderately dilated. 4. Right atrial size was moderately dilated. 5. The mitral valve is normal in structure. Trivial mitral valve regurgitation. No evidence of mitral stenosis. 6. The tricuspid valve is abnormal. 7. The aortic valve is tricuspid. There is mild calcification of the aortic valve. Aortic valve regurgitation is not visualized. No aortic stenosis is present. 8. Mild pulmonic stenosis. 9. The inferior vena cava is dilated in size with >50% respiratory variability, suggesting right atrial pressure of 8 mmHg. 10. Evidence of atrial level shunting detected by color flow Doppler. There is a small patent foramen ovale with predominantly left to right shunting across the atrial septum.  Comparison(s): No prior Echocardiogram. MRI 03/10/2009 EF 60%.  Conclusion(s)/Recommendation(s): Low normal LV function, prominent LV trabeculation, mild diffuse hypokinesis with more prominent hypokinesis and trabeculation at the apex.  FINDINGS Left Ventricle: Prominent trabeculation noted, especially at apex, with mild global hypokinesis with more prominent apical hypokinesis. Left ventricular ejection fraction, by estimation, is 50 to 55%. The left ventricle has low normal function. The left ventricle demonstrates global hypokinesis. 3D ejection fraction reviewed and evaluated as part of the interpretation. Alternate measurement of EF is felt to be most reflective of LV function. The left ventricular internal cavity size was normal in size. There is no left ventricular hypertrophy. Abnormal (paradoxical) septal motion consistent with post-operative status. Left ventricular diastolic parameters were normal.  Right Ventricle: The right  ventricular size is mildly enlarged. No increase in right ventricular wall thickness. Right ventricular systolic function is mildly reduced.  Left Atrium: Left atrial size was moderately dilated.  Right Atrium: Right atrial size was moderately dilated.  Pericardium: There is no evidence of pericardial effusion.  Mitral Valve: The mitral valve is normal in structure. Trivial mitral valve regurgitation. No evidence of mitral valve stenosis.  Tricuspid Valve: Mildly thickened TV leaflets. The tricuspid valve is abnormal. Tricuspid valve regurgitation is mild . No evidence of tricuspid stenosis.  Aortic Valve: The aortic valve is tricuspid. There is mild calcification of the aortic valve. Aortic valve regurgitation is not visualized. No aortic stenosis is present. Aortic valve mean gradient measures 3.0 mmHg. Aortic valve peak gradient measures 6.5 mmHg. Aortic valve area,  by VTI measures 1.88 cm.  Pulmonic Valve: The pulmonic valve was not well visualized. Pulmonic valve regurgitation is not visualized. Mild pulmonic stenosis.  Aorta: The aortic root, ascending aorta, aortic arch and descending aorta are all structurally normal, with no evidence of dilitation or obstruction.  Venous: The inferior vena cava is dilated in size with greater than 50% respiratory variability, suggesting right atrial pressure of 8 mmHg.  IAS/Shunts: There is left bowing of the interatrial septum, suggestive of elevated right atrial pressure. Evidence of atrial level shunting detected by color flow Doppler. A small patent foramen ovale is detected with predominantly left to right shunting across the atrial septum.    Assessment & Plan   1.  ***   Josefa HERO. Stanley Lyness NP-C     03/17/2024, 12:50 PM [facility] [address] Office [phone] Fax [phone]    I spent***minutes examining this patient, reviewing medications, and using patient centered shared decision  making involving their cardiac care.   I spent  20 minutes reviewing past medical history,  medications, and prior cardiac tests.

## 2024-03-19 ENCOUNTER — Ambulatory Visit: Attending: General Practice | Admitting: General Practice

## 2024-03-23 DIAGNOSIS — M6281 Muscle weakness (generalized): Secondary | ICD-10-CM | POA: Diagnosis not present

## 2024-03-23 DIAGNOSIS — C61 Malignant neoplasm of prostate: Secondary | ICD-10-CM | POA: Diagnosis not present

## 2024-03-24 ENCOUNTER — Encounter: Payer: Self-pay | Admitting: Cardiovascular Disease

## 2024-03-24 NOTE — Telephone Encounter (Signed)
 Error

## 2024-03-24 NOTE — Progress Notes (Unsigned)
 Cardiology Clinic Note   Patient Name: Rodney Buchanan Date of Encounter: 03/25/2024  Primary Care Provider:  Pcp, No Primary Cardiologist:  Darryle ONEIDA Decent, MD  Patient Profile    Rodney Buchanan 63 year old male presents to the clinic today for follow-up evaluation of his precordial pain and preoperative cardiac evaluation.  Past Medical History    Past Medical History:  Diagnosis Date   ETOH abuse    Stroke (HCC)    x7   Past Surgical History:  Procedure Laterality Date   CARDIAC SURGERY     Murmur Repair    Allergies  No Known Allergies  History of Present Illness    Rodney Buchanan is a PMH of CVA, EtOH, tobacco abuse, congenital heart disease, and chest pain.  He was initially referred by his PCP for evaluation due to abnormal EKG and chest pain.  He was seen by Dr. Decent 09/02/2023.  He described infrequent sharp chest discomfort that was short-lived and nonexertional.  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  He reported cramping in his right leg which occasionally caused him to stop and stretch.  He had been a smoker since age 57 and admitted to daily alcohol consumption.  He reported that this helped him sleep.  He had a stroke 16 years prior and underwent heart surgery at the age of 5 for a valvular issue.  He reported taking aspirin daily.  He denied taking blood pressure medication.  He noted that his blood pressure would fluctuate.  He did not monitor this at home.  His wife noted that he had been prescribed blood pressure medication but he had not yet started it.  He denied family history of heart disease.  He presents to the clinic today for follow-up evaluation and preoperative cardiac evaluation.  He states he continues to do well.  He does note episodes of cramping mainly on his left side.  He relates this to his footwear.  He does not notice any cramps at home when he is active.  He continues to work Holiday representative.  We  reviewed his previous clinic visit and cardiac history.  He expressed understanding.  We reviewed his upcoming surgery.  His EKG shows sinus rhythm.  His blood pressure has been well-controlled.  I will refill his atorvastatin  and plan follow-up in 12 months..  He denies chest pain, shortness of breath, lower extremity edema, fatigue, palpitations, melena, hematuria, hemoptysis, diaphoresis, weakness, presyncope, syncope, orthopnea, and PND.     Home Medications    Prior to Admission medications   Medication Sig Start Date End Date Taking? Authorizing Provider  amLODipine  (NORVASC ) 5 MG tablet TAKE 1 TABLET BY MOUTH DAILY 12/12/23   O'Neal, Darryle Ned, MD  aspirin EC 81 MG tablet Take 81 mg by mouth daily. Swallow whole.    [provider]  atorvastatin  (LIPITOR) 20 MG tablet Take 1 tablet (20 mg total) by mouth daily. 09/03/23   O'NealDarryle Ned, MD  levofloxacin (LEVAQUIN) 750 MG tablet Take 750 mg by mouth daily. 08/31/23   [provider]  solifenacin (VESICARE) 5 MG tablet Take 5 mg by mouth daily. 08/31/23   [provider]    Family History    Family History  Problem Relation Age of Onset   Stroke Mother    He indicated that his mother is deceased. He indicated that his father is deceased.  Social History    Social History   Socioeconomic History  Marital status: Married    Spouse name: Not on file   Number of children: 2   Years of education: Not on file   Highest education level: Not on file  Occupational History   Not on file  Tobacco Use   Smoking status: Every Day    Current packs/day: 1.00    Average packs/day: 1 pack/day for 53.6 years (53.6 ttl pk-yrs)    Types: Cigarettes    Start date: 09/01/1970   Smokeless tobacco: Never  Vaping Use   Vaping status: Never Used  Substance and Sexual Activity   Alcohol use: Yes    Alcohol/week: 28.0 standard drinks of alcohol    Types: 28 Shots of liquor per week   Drug use: Not  Currently    Comment: heroin   Sexual activity: Not on file  Other Topics Concern   Not on file  Social History Narrative   Not on file   Social Drivers of Health   Financial Resource Strain: Not on file  Food Insecurity: Not on file  Transportation Needs: Not on file  Physical Activity: Not on file  Stress: Not on file  Social Connections: Not on file  Intimate Partner Violence: Not on file     Review of Systems    General:  No chills, fever, night sweats or weight changes.  Cardiovascular:  No chest pain, dyspnea on exertion, edema, orthopnea, palpitations, paroxysmal nocturnal dyspnea. Dermatological: No rash, lesions/masses Respiratory: No cough, dyspnea Urologic: No hematuria, dysuria Abdominal:   No nausea, vomiting, diarrhea, bright red blood per rectum, melena, or hematemesis Neurologic:  No visual changes, wkns, changes in mental status. All other systems reviewed and are otherwise negative except as noted above.  Physical Exam    VS:  BP 120/60 (BP Location: Left Arm, Patient Position: Sitting, Cuff Size: Normal)   Pulse 78   Ht 5' 9 (1.753 m)   Wt 140 lb (63.5 kg)   BMI 20.67 kg/m  , BMI Body mass index is 20.67 kg/m. GEN: Well nourished, well developed, in no acute distress. HEENT: normal. Neck: Supple, no JVD, carotid bruits, or masses. Cardiac: RRR, no murmurs, rubs, or gallops. No clubbing, cyanosis, edema.  Radials/DP/PT 2+ and equal bilaterally.  Respiratory:  Respirations regular and unlabored, clear to auscultation bilaterally. GI: Soft, nontender, nondistended, BS + x 4. MS: no deformity or atrophy. Skin: warm and dry, no rash. Neuro:  Strength and sensation are intact. Psych: Normal affect.  Accessory Clinical Findings    Recent Labs: 09/02/2023: ALT 36; BUN 10; Creatinine, Ser 0.98; Hemoglobin 15.6; Platelets 202; Potassium 4.5; Sodium 143; TSH 0.839   Recent Lipid Panel    Component Value Date/Time   CHOL 177 09/02/2023 1537   TRIG 70  09/02/2023 1537   HDL 67 09/02/2023 1537   CHOLHDL 2.6 09/02/2023 1537   CHOLHDL 4.3 03/09/2009 0440   VLDL 36 03/09/2009 0440   LDLCALC 97 09/02/2023 1537         ECG personally reviewed by me today- EKG Interpretation Date/Time:  Wednesday March 25 2024 13:16:55 EDT Ventricular Rate:  78 PR Interval:  154 QRS Duration:  78 QT Interval:  406 QTC Calculation: 462 R Axis:   56  Text Interpretation: Normal sinus rhythm Normal ECG When compared with ECG of 02-Sep-2023 15:06, No significant change was found Confirmed by Emelia Hazy [phone]) on 03/25/2024 1:21:12 PM    Echocardiogram 11/12/2023  IMPRESSIONS   1. Prominent trabeculation noted, especially at apex, with mild global hypokinesis  with more prominent apical hypokinesis. Left ventricular ejection fraction, by estimation, is 50 to 55%. The left ventricle has low normal function. The left ventricle demonstrates global hypokinesis. Left ventricular diastolic parameters were normal. 2. Right ventricular systolic function is mildly reduced. The right ventricular size is mildly enlarged. 3. Left atrial size was moderately dilated. 4. Right atrial size was moderately dilated. 5. The mitral valve is normal in structure. Trivial mitral valve regurgitation. No evidence of mitral stenosis. 6. The tricuspid valve is abnormal. 7. The aortic valve is tricuspid. There is mild calcification of the aortic valve. Aortic valve regurgitation is not visualized. No aortic stenosis is present. 8. Mild pulmonic stenosis. 9. The inferior vena cava is dilated in size with >50% respiratory variability, suggesting right atrial pressure of 8 mmHg. 10. Evidence of atrial level shunting detected by color flow Doppler. There is a small patent foramen ovale with predominantly left to right shunting across the atrial septum.  Comparison(s): No prior Echocardiogram. MRI 03/10/2009 EF 60%.  Conclusion(s)/Recommendation(s): Low normal LV function, prominent LV  trabeculation, mild diffuse hypokinesis with more prominent hypokinesis and trabeculation at the apex.  FINDINGS Left Ventricle: Prominent trabeculation noted, especially at apex, with mild global hypokinesis with more prominent apical hypokinesis. Left ventricular ejection fraction, by estimation, is 50 to 55%. The left ventricle has low normal function. The left ventricle demonstrates global hypokinesis. 3D ejection fraction reviewed and evaluated as part of the interpretation. Alternate measurement of EF is felt to be most reflective of LV function. The left ventricular internal cavity size was normal in size. There is no left ventricular hypertrophy. Abnormal (paradoxical) septal motion consistent with post-operative status. Left ventricular diastolic parameters were normal.  Right Ventricle: The right ventricular size is mildly enlarged. No increase in right ventricular wall thickness. Right ventricular systolic function is mildly reduced.  Left Atrium: Left atrial size was moderately dilated.  Right Atrium: Right atrial size was moderately dilated.  Pericardium: There is no evidence of pericardial effusion.  Mitral Valve: The mitral valve is normal in structure. Trivial mitral valve regurgitation. No evidence of mitral valve stenosis.  Tricuspid Valve: Mildly thickened TV leaflets. The tricuspid valve is abnormal. Tricuspid valve regurgitation is mild . No evidence of tricuspid stenosis.  Aortic Valve: The aortic valve is tricuspid. There is mild calcification of the aortic valve. Aortic valve regurgitation is not visualized. No aortic stenosis is present. Aortic valve mean gradient measures 3.0 mmHg. Aortic valve peak gradient measures 6.5 mmHg. Aortic valve area, by VTI measures 1.88 cm.  Pulmonic Valve: The pulmonic valve was not well visualized. Pulmonic valve regurgitation is not visualized. Mild pulmonic stenosis.  Aorta: The aortic root, ascending aorta, aortic arch and  descending aorta are all structurally normal, with no evidence of dilitation or obstruction.  Venous: The inferior vena cava is dilated in size with greater than 50% respiratory variability, suggesting right atrial pressure of 8 mmHg.  IAS/Shunts: There is left bowing of the interatrial septum, suggestive of elevated right atrial pressure. Evidence of atrial level shunting detected by color flow Doppler. A small patent foramen ovale is detected with predominantly left to right shunting across the atrial septum.    Assessment & Plan   1.  Precordial pain-denies exertional chest discomfort.   Echocardiogram reassuring with normal EF. Heart healthy low-sodium diet Maintain physical activity No plans for ischemic evaluation  Essential hypertension-BP today 120/60 Maintain blood pressure log Heart healthy low-sodium diet Maintain physical activity Continue amlodipine   Claudication-Notes occasional episodes of lower  extremity claudication.  These are brief and resolved with stretching.    Lower extremity ABIs were negative.  11/12/2023.  I suspect this is related to musculoskeletal issues. Maintain p.o. hydration  History of CVA-neurologically intact.  CVA 6/10. Follows with PCP  Preoperative cardiac evaluation-Procedure:  robotic prostatectomy node dissection and left inguinal hernia repair   Date of Surgery:  Clearance 04/29/24                                  Surgeon:  Dr. Alvaro Socks Group or Practice Name:  [facility]  Phone number:  [phone] Ext: [phone] Fax number:  [phone]      Primary Cardiologist: Dr. Barbaraann  Chart reviewed as part of pre-operative protocol coverage. Given past medical history and time since last visit, based on ACC/AHA guidelines, Teejay Meader would be at acceptable risk for the planned procedure without further cardiovascular testing.   His RCRI is moderate risk, 6.6% risk of major cardiac event.  He is able to complete greater than  4 METS of physical activity.  Patient was advised that if he develops new symptoms prior to surgery to contact our office to arrange a follow-up appointment.  He verbalized understanding.  Patient's aspirin is not managed by cardiology.  Recommendations for holding aspirin will need to come from prescribing provider.  I will route this recommendation to the requesting party via Epic fax function and remove from pre-op pool.   Disposition: Follow-up with Dr. Barbaraann or me in 9-12 months.   Josefa HERO. Copeland Lapier NP-C     03/25/2024, 1:37 PM [facility] [address] Office [phone] Fax [phone]    I spent 14 minutes examining this patient, reviewing medications, and using patient centered shared decision making involving their cardiac care.   I spent  20 minutes reviewing past medical history,  medications, and prior cardiac tests.

## 2024-03-25 ENCOUNTER — Telehealth: Payer: Self-pay | Admitting: Cardiovascular Disease

## 2024-03-25 ENCOUNTER — Encounter: Payer: Self-pay | Admitting: General Practice

## 2024-03-25 ENCOUNTER — Ambulatory Visit: Attending: General Practice | Admitting: General Practice

## 2024-03-25 VITALS — BP 120/60 | HR 78 | Ht 69.0 in | Wt 140.0 lb

## 2024-03-25 DIAGNOSIS — I10 Essential (primary) hypertension: Secondary | ICD-10-CM | POA: Diagnosis not present

## 2024-03-25 DIAGNOSIS — R072 Precordial pain: Secondary | ICD-10-CM

## 2024-03-25 DIAGNOSIS — I739 Peripheral vascular disease, unspecified: Secondary | ICD-10-CM | POA: Diagnosis not present

## 2024-03-25 DIAGNOSIS — Z01810 Encounter for preprocedural cardiovascular examination: Secondary | ICD-10-CM

## 2024-03-25 MED ORDER — ATORVASTATIN CALCIUM 20 MG PO TABS: 20.0000 mg | ORAL_TABLET | Freq: Every day | ORAL | 3 refills | Status: AC

## 2024-03-25 NOTE — Patient Instructions (Signed)
 Medication Instructions:  NO CHANGES   Lab Work: NONE   Testing/Procedures: NONE  Follow-Up: At [facility], you and your health needs are our priority.  As part of our continuing mission to provide you with exceptional heart care, our providers are all part of one team.  This team includes your primary Cardiologist (physician) and Advanced Practice Providers or APPs (Physician Assistants and Nurse Practitioners) who all work together to provide you with the care you need, when you need it.  Your next appointment:   1 YEAR  Provider:   Darryle ONEIDA Decent, MD OR Josefa Beauvais, NP   We recommend signing up for the patient portal called MyChart.  Sign up information is provided on this After Visit Summary.  MyChart is used to connect with patients for Virtual Visits (Telemedicine).  Patients are able to view lab/test results, encounter notes, upcoming appointments, etc.  Non-urgent messages can be sent to your provider as well.   To learn more about what you can do with MyChart, go to [email or web address].

## 2024-03-25 NOTE — Telephone Encounter (Signed)
 Patient has appointment with Rodney Buchanan today at 1:55. I informed him about the pre-op clearance.

## 2024-03-25 NOTE — Telephone Encounter (Signed)
   Pre-operative Risk Assessment    Patient Name: Rodney Buchanan  DOB: [birth year] MRN: [id number]   Date of last office visit: 03/25/24 Date of next office visit: Not yet scheduled   Request for Surgical Clearance    Procedure:  Robotic prostatectomy and node dissection, Left inguinal hernia repair  Date of Surgery:  Clearance 04/29/24                                Surgeon:  Dr. Alvaro Socks Group or Practice Name:  [facility] Phone number:  [phone]  Fax number:  [phone]   Type of Clearance Requested:   - Medical  - Pharmacy:  Hold Aspirin      Type of Anesthesia:  General    Additional requests/questions:     SignedHerma KATHEE Blush   03/25/2024, 8:50 AM

## 2024-04-21 ENCOUNTER — Encounter (HOSPITAL_COMMUNITY): Payer: Self-pay

## 2024-04-21 NOTE — Patient Instructions (Addendum)
 SURGICAL WAITING ROOM VISITATION Patients having surgery or a procedure may have no more than 2 support people in the waiting area - these visitors may rotate.    Children under the age of 77 must have an adult with them who is not the patient.  If the patient needs to stay at the hospital during part of their recovery, the visitor guidelines for inpatient rooms apply. Pre-op nurse will coordinate an appropriate time for 1 support person to accompany patient in pre-op.  This support person may not rotate.    Please refer to the [facility] website for the visitor guidelines for Inpatients (after your surgery is over and you are in a regular room).       Your procedure is scheduled on: 04-29-24   Report to [facility] Main Entrance    Report to admitting at 6:15 AM   Call this number if you have problems the morning of surgery [phone]   Follow a clear liquid diet the day before surgery   Do not eat food or drink liquids :After Midnight.           If you have questions, please contact your surgeon's office.   FOLLOW BOWEL PREP AND ANY ADDITIONAL PRE OP INSTRUCTIONS YOU RECEIVED FROM YOUR SURGEON'S OFFICE!!!    - Drink 8 ounces of Magnesium citrate at noon the day before surgery    Oral Hygiene is also important to reduce your risk of infection.                                    Remember - BRUSH YOUR TEETH THE MORNING OF SURGERY WITH YOUR REGULAR TOOTHPASTE   Do NOT smoke after Midnight   Take these medicines the morning of surgery with A SIP OF WATER:    Amlodipine    Atorvastatin    Solfenacin  Stop all vitamins and herbal supplements 7 days before surgery  Bring CPAP mask and tubing day of surgery.                              You may not have any metal on your body including  jewelry, and body piercing             Do not wear lotions, powders cologne, or deodorant              Men may shave face and neck.   Do not bring valuables to the hospital. [facility] IS NOT RESPONSIBLE   FOR VALUABLES.   Contacts, dentures or bridgework may not be worn into surgery.   Bring small overnight bag day of surgery.   DO NOT BRING YOUR HOME MEDICATIONS TO THE HOSPITAL. PHARMACY WILL DISPENSE MEDICATIONS LISTED ON YOUR MEDICATION LIST TO YOU DURING YOUR ADMISSION IN THE HOSPITAL!    Special Instructions: Bring a copy of your healthcare power of attorney and living will documents the day of surgery if you haven't scanned them before.              Please read over the following fact sheets you were given: IF YOU HAVE QUESTIONS ABOUT YOUR PRE-OP INSTRUCTIONS PLEASE CALL [phone] Gwen  If you received a COVID test during your pre-op visit  it is requested that you wear a mask when out in public, stay away from anyone that may not be feeling well and notify  your surgeon if you develop symptoms. If you test positive for Covid or have been in contact with anyone that has tested positive in the last 10 days please notify you surgeon.  [facility] - Preparing for Surgery Before surgery, you can play an important role.  Because skin is not sterile, your skin needs to be as free of germs as possible.  You can reduce the number of germs on your skin by washing with CHG (chlorahexidine gluconate) soap before surgery.  CHG is an antiseptic cleaner which kills germs and bonds with the skin to continue killing germs even after washing. Please DO NOT use if you have an allergy to CHG or antibacterial soaps.  If your skin becomes reddened/irritated stop using the CHG and inform your nurse when you arrive at Short Stay. Do not shave (including legs and underarms) for at least 48 hours prior to the first CHG shower.  You may shave your face/neck.  Please follow these instructions carefully:  1.  Shower with CHG Soap the night before surgery and the  morning of surgery.  2.  If you choose to wash your hair, wash your hair first as usual with your normal  shampoo.  3.  After  you shampoo, rinse your hair and body thoroughly to remove the shampoo.                             4.  Use CHG as you would any other liquid soap.  You can apply chg directly to the skin and wash.  Gently with a scrungie or clean washcloth.  5.  Apply the CHG Soap to your body ONLY FROM THE NECK DOWN.   Do   not use on face/ open                           Wound or open sores. Avoid contact with eyes, ears mouth and   genitals (private parts).                       Wash face,  Genitals (private parts) with your normal soap.             6.  Wash thoroughly, paying special attention to the area where your    surgery  will be performed.  7.  Thoroughly rinse your body with warm water from the neck down.  8.  DO NOT shower/wash with your normal soap after using and rinsing off the CHG Soap.                9.  Pat yourself dry with a clean towel.            10.  Wear clean pajamas.            11.  Place clean sheets on your bed the night of your first shower and do not  sleep with pets. Day of Surgery : Do not apply any lotions/deodorants the morning of surgery.  Please wear clean clothes to the hospital/surgery center.  FAILURE TO FOLLOW THESE INSTRUCTIONS MAY RESULT IN THE CANCELLATION OF YOUR SURGERY  PATIENT SIGNATURE_________________________________  NURSE SIGNATURE__________________________________  ________________________________________________________________________   WHAT IS A BLOOD TRANSFUSION? Blood Transfusion Information  A transfusion is the replacement of blood or some of its parts. Blood is made up of multiple cells which provide different functions. Red blood cells carry oxygen  and are used for blood loss replacement. White blood cells fight against infection. Platelets control bleeding. Plasma helps clot blood. Other blood products are available for specialized needs, such as hemophilia or other clotting disorders. BEFORE THE TRANSFUSION  Who gives blood for  transfusions?  Healthy volunteers who are fully evaluated to make sure their blood is safe. This is blood bank blood. Transfusion therapy is the safest it has ever been in the practice of medicine. Before blood is taken from a donor, a complete history is taken to make sure that person has no history of diseases nor engages in risky social behavior (examples are intravenous drug use or sexual activity with multiple partners). The donor's travel history is screened to minimize risk of transmitting infections, such as malaria. The donated blood is tested for signs of infectious diseases, such as HIV and hepatitis. The blood is then tested to be sure it is compatible with you in order to minimize the chance of a transfusion reaction. If you or a relative donates blood, this is often done in anticipation of surgery and is not appropriate for emergency situations. It takes many days to process the donated blood. RISKS AND COMPLICATIONS Although transfusion therapy is very safe and saves many lives, the main dangers of transfusion include:  Getting an infectious disease. Developing a transfusion reaction. This is an allergic reaction to something in the blood you were given. Every precaution is taken to prevent this. The decision to have a blood transfusion has been considered carefully by your caregiver before blood is given. Blood is not given unless the benefits outweigh the risks. AFTER THE TRANSFUSION Right after receiving a blood transfusion, you will usually feel much better and more energetic. This is especially true if your red blood cells have gotten low (anemic). The transfusion raises the level of the red blood cells which carry oxygen, and this usually causes an energy increase. The nurse administering the transfusion will monitor you carefully for complications. HOME CARE INSTRUCTIONS  No special instructions are needed after a transfusion. You may find your energy is better. Speak with your  caregiver about any limitations on activity for underlying diseases you may have. SEEK MEDICAL CARE IF:  Your condition is not improving after your transfusion. You develop redness or irritation at the intravenous (IV) site. SEEK IMMEDIATE MEDICAL CARE IF:  Any of the following symptoms occur over the next 12 hours: Shaking chills. You have a temperature by mouth above 102 F (38.9 C), not controlled by medicine. Chest, back, or muscle pain. People around you feel you are not acting correctly or are confused. Shortness of breath or difficulty breathing. Dizziness and fainting. You get a rash or develop hives. You have a decrease in urine output. Your urine turns a dark color or changes to pink, red, or brown. Any of the following symptoms occur over the next 10 days: You have a temperature by mouth above 102 F (38.9 C), not controlled by medicine. Shortness of breath. Weakness after normal activity. The white part of the eye turns yellow (jaundice). You have a decrease in the amount of urine or are urinating less often. Your urine turns a dark color or changes to pink, red, or brown. Document Released: 08/31/2000 Document Revised: 11/26/2011 Document Reviewed: 04/19/2008 [facility] Patient Information 2014 [place], [place].  _______________________________________________________________________

## 2024-04-21 NOTE — Progress Notes (Addendum)
 Date of COVID positive in last 90 days:  PCP - No PCP Cardiologist - Darryle Decent, MD  Cardiac clearance in Epic dated 03-25-24  Chest x-ray -  EKG - 03-25-24 Epic Stress Test -  ECHO - 11-12-23 Epic Cardiac Cath -  Pacemaker/ICD device last checked: Spinal Cord Stimulator: Cardiac MR - 03-10-09 Epic  Bowel Prep - Mag Citrate at noon the day before surgery  Sleep Study -  CPAP -   Fasting Blood Sugar -  Checks Blood Sugar _____ times a day  Last dose of GLP1 agonist-  N/A GLP1 instructions:  Do not take after     Last dose of SGLT-2 inhibitors-  N/A SGLT-2 instructions:  Do not take after     Blood Thinner Instructions:  Last dose:   Time: Aspirin Instructions: Last Dose:  Activity level:  Can go up a flight of stairs and perform activities of daily living without stopping and without symptoms of chest pain or shortness of breath.  Able to exercise without symptoms  Unable to go up a flight of stairs without symptoms of     Anesthesia review:  Precordial pain followed by cardiology.  HTN, hx of CVA  Patient denies shortness of breath, fever, cough and chest pain at PAT appointment  Patient verbalized understanding of instructions that were given to them at the PAT appointment. Patient was also instructed that they will need to review over the PAT instructions again at home before surgery.

## 2024-04-23 ENCOUNTER — Encounter (HOSPITAL_COMMUNITY)
Admission: RE | Admit: 2024-04-23 | Discharge: 2024-04-23 | Disposition: A | Discharge status: Home / Self Care | Source: Ambulatory Visit

## 2024-04-23 DIAGNOSIS — Z01818 Encounter for other preprocedural examination: Secondary | ICD-10-CM

## 2024-04-23 DIAGNOSIS — F1090 Alcohol use, unspecified, uncomplicated: Secondary | ICD-10-CM

## 2024-04-23 HISTORY — DX: Essential (primary) hypertension: I10

## 2024-04-25 NOTE — Patient Instructions (Signed)
 SURGICAL WAITING ROOM VISITATION Patients having surgery or a procedure may have no more than 2 support people in the waiting area - these visitors may rotate in the visitor waiting room.   If the patient needs to stay at the hospital during part of their recovery, the visitor guidelines for inpatient rooms apply.  PRE-OP VISITATION  Pre-op nurse will coordinate an appropriate time for 1 support person to accompany the patient in pre-op.  This support person may not rotate.  This visitor will be contacted when the time is appropriate for the visitor to come back in the pre-op area.  Please refer to the [facility] website for the visitor guidelines for Inpatients (after your surgery is over and you are in a regular room).  You are not required to quarantine at this time prior to your surgery. However, you must do this: Hand Hygiene often Do NOT share personal items Notify your provider if you are in close contact with someone who has COVID or you develop fever 100.4 or greater, new onset of sneezing, cough, sore throat, shortness of breath or body aches.  If you test positive for Covid or have been in contact with anyone that has tested positive in the last 10 days please notify you surgeon.    Your procedure is scheduled on:  Wednesday  April 29, 2024  Report to [facility] Main Entrance: [address] entrance where the [employer] is available.   Report to admitting at: 06:15    AM  Call this number if you have any questions or problems the morning of surgery [phone]  DO NOT EAT OR DRINK ANYTHING AFTER MIDNIGHT THE NIGHT PRIOR TO YOUR SURGERY / PROCEDURE.   FOLLOW  ANY ADDITIONAL PRE OP INSTRUCTIONS YOU RECEIVED FROM YOUR SURGEON'S OFFICE!!!  MAGNESIUM  CITRATE:  Obtain one (1)  bottle (8 oz) of Magnesium  Citrate at your pharmacy. Drink entire bottle at 12:00 noon the day before your surgery/ procedure.     Oral Hygiene is also important to reduce your risk of infection.         Remember - BRUSH YOUR TEETH THE MORNING OF SURGERY WITH YOUR REGULAR TOOTHPASTE  Do NOT smoke after Midnight the night before surgery.  STOP TAKING all Vitamins, Herbs and supplements 1 week before your surgery.   Take ONLY these medicines the morning of surgery with A SIP OF WATER : amlodipine    You may not have any metal on your body including  jewelry, and body piercing  Do not wear  lotions, powders, cologne, or deodorant  Men may shave face and neck.  Contacts, Hearing Aids, dentures or bridgework may not be worn into surgery. DENTURES WILL BE REMOVED PRIOR TO SURGERY PLEASE DO NOT APPLY Poly grip OR ADHESIVES!!!  You may bring a small overnight bag with you on the day of surgery, only pack items that are not valuable. [facility] IS NOT RESPONSIBLE   FOR VALUABLES THAT ARE LOST OR STOLEN.   Do not bring your home medications to the hospital. The Pharmacy will dispense medications listed on your medication list to you during your admission in the Hospital.  Please read over the following fact sheets you were given: IF YOU HAVE QUESTIONS ABOUT YOUR PRE-OP INSTRUCTIONS, PLEASE CALL [phone].    - Preparing for Surgery Before surgery, you can play an important role.  Because skin is not sterile, your skin needs to be as free of germs as possible.  You can reduce the number of  germs on your skin by washing with CHG (chlorahexidine gluconate) soap before surgery.  CHG is an antiseptic cleaner which kills germs and bonds with the skin to continue killing germs even after washing. Please DO NOT use if you have an allergy to CHG or antibacterial soaps.  If your skin becomes reddened/irritated stop using the CHG and inform your nurse when you arrive at Short Stay. Do not shave (including legs and underarms) for at least 48 hours prior to the first CHG shower.  You may shave your face/neck.  Please follow these instructions carefully:  1.  Shower with CHG Soap the  night before surgery and the  morning of surgery.  2.  If you choose to wash your hair, wash your hair first as usual with your normal  shampoo.  3.  After you shampoo, rinse your hair and body thoroughly to remove the shampoo.                             4.  Use CHG as you would any other liquid soap.  You can apply chg directly to the skin and wash.  Gently with a scrungie or clean washcloth.  5.  Apply the CHG Soap to your body ONLY FROM THE NECK DOWN.   Do not use on face/ open                           Wound or open sores. Avoid contact with eyes, ears mouth and genitals (private parts).                       Wash face,  Genitals (private parts) with your normal soap.             6.  Wash thoroughly, paying special attention to the area where your  surgery  will be performed.  7.  Thoroughly rinse your body with warm water  from the neck down.  8.  DO NOT shower/wash with your normal soap after using and rinsing off the CHG Soap.            9.  Pat yourself dry with a clean towel.            10.  Wear clean pajamas.            11.  Place clean sheets on your bed the night of your first shower and do not  sleep with pets.  ON THE DAY OF SURGERY : Do not apply any lotions/deodorants the morning of surgery.  Please wear clean clothes to the hospital/surgery center.    FAILURE TO FOLLOW THESE INSTRUCTIONS MAY RESULT IN THE CANCELLATION OF YOUR SURGERY  PATIENT SIGNATURE_________________________________  NURSE SIGNATURE__________________________________  ________________________________________________________________________

## 2024-04-25 NOTE — Progress Notes (Addendum)
 COVID Vaccine received:  [x]  No []  Yes Date of any COVID positive Test in last 90 days:  none  PCP - [employer] on [address]. ?[phone] Cardiologist - Darryle Decent, MD , Josefa Beauvais, NP 03-25-2024  Cardiac clearance Neurology- Pastor Falling, MD   Chest x-ray - 12-30-2021  1v  Epic EKG - 03-25-2024  Epic  Stress Test -  ECHO - 11-12-2023  Epic Cardiac Cath -  CT Coronary Calcium  score:   Bowel Prep - []  No  [x]   Yes _Mag. Citrate noon day prior  Pacemaker / ICD device [x]  No []  Yes   Spinal Cord Stimulator:[x]  No []  Yes       History of Sleep Apnea? [x]  No []  Yes   CPAP used?- [x]  No []  Yes    Patient has: [x]  NO Hx DM   []  Pre-DM   []  DM1  []   DM2 Does the patient monitor blood sugar?   [x]  N/A   []  No []  Yes  Last A1c was:   5.0 on 09-02-2023       Blood Thinner / Instructions:  none Aspirin Instructions:  ASA 81 mg  stopped over 2 months ago  ERAS Protocol Ordered: [x]  No  []  Yes Patient is to be NPO after: MN Prior  Dental hx: [x]  Dentures: upper dentures only []  N/A      []  Bridge or Partial:                   []  Loose or Damaged teeth:   Activity level: Able to walk up 2 flights of stairs without becoming significantly short of breath or having chest pain?  []  No   [x]    Yes  Patient can perform ADLs without assistance. []  No   [x]   Yes  Anesthesia review: Hx mult. CVAs, HTN, Hx murmur (had unknown valve surgery at age 84) done in [place],   [place], smoker  Patient denies any S&S of respiratory illness or Covid - no shortness of breath, fever, cough or chest pain at PAT appointment.  Patient verbalized understanding and agreement to the Pre-Surgical Instructions that were given to them at this PAT appointment. Patient was also educated of the need to review these PAT instructions again prior to hisr surgery.I reviewed the appropriate phone numbers to call if they have any and questions or concerns.

## 2024-04-27 ENCOUNTER — Other Ambulatory Visit: Payer: Self-pay

## 2024-04-27 ENCOUNTER — Encounter (HOSPITAL_COMMUNITY): Payer: Self-pay

## 2024-04-27 ENCOUNTER — Encounter (HOSPITAL_COMMUNITY)
Admission: RE | Admit: 2024-04-27 | Discharge: 2024-04-27 | Disposition: A | Discharge status: Home / Self Care | Source: Ambulatory Visit | Attending: Urology | Admitting: Urology

## 2024-04-27 DIAGNOSIS — I10 Essential (primary) hypertension: Secondary | ICD-10-CM | POA: Diagnosis not present

## 2024-04-27 DIAGNOSIS — F1090 Alcohol use, unspecified, uncomplicated: Secondary | ICD-10-CM | POA: Insufficient documentation

## 2024-04-27 DIAGNOSIS — M1990 Unspecified osteoarthritis, unspecified site: Secondary | ICD-10-CM | POA: Diagnosis not present

## 2024-04-27 DIAGNOSIS — Z8673 Personal history of transient ischemic attack (TIA), and cerebral infarction without residual deficits: Secondary | ICD-10-CM | POA: Insufficient documentation

## 2024-04-27 DIAGNOSIS — Z01812 Encounter for preprocedural laboratory examination: Secondary | ICD-10-CM | POA: Insufficient documentation

## 2024-04-27 DIAGNOSIS — K4090 Unilateral inguinal hernia, without obstruction or gangrene, not specified as recurrent: Secondary | ICD-10-CM | POA: Insufficient documentation

## 2024-04-27 DIAGNOSIS — Z01818 Encounter for other preprocedural examination: Secondary | ICD-10-CM

## 2024-04-27 DIAGNOSIS — F17210 Nicotine dependence, cigarettes, uncomplicated: Secondary | ICD-10-CM | POA: Diagnosis not present

## 2024-04-27 DIAGNOSIS — C61 Malignant neoplasm of prostate: Secondary | ICD-10-CM | POA: Insufficient documentation

## 2024-04-27 DIAGNOSIS — Z8774 Personal history of (corrected) congenital malformations of heart and circulatory system: Secondary | ICD-10-CM | POA: Diagnosis not present

## 2024-04-27 HISTORY — DX: Nicotine dependence, unspecified, uncomplicated: F17.200

## 2024-04-27 HISTORY — DX: Cardiac murmur, unspecified: R01.1

## 2024-04-27 HISTORY — DX: Unspecified osteoarthritis, unspecified site: M19.90

## 2024-04-27 LAB — COMPREHENSIVE METABOLIC PANEL WITH GFR
ALT: 100 U/L — ABNORMAL HIGH (ref 0–44)
AST: 134 U/L — ABNORMAL HIGH (ref 15–41)
Albumin: 4.2 g/dL (ref 3.5–5.0)
Alkaline Phosphatase: 129 U/L — ABNORMAL HIGH (ref 38–126)
Anion gap: 13 (ref 5–15)
BUN: 12 mg/dL (ref 8–23)
CO2: 23 mmol/L (ref 22–32)
Calcium: 9.5 mg/dL (ref 8.9–10.3)
Chloride: 101 mmol/L (ref 98–111)
Creatinine, Ser: 0.56 mg/dL — ABNORMAL LOW (ref 0.61–1.24)
GFR, Estimated: >60 mL/min (ref >60)
Glucose, Bld: 76 mg/dL (ref 70–99)
Potassium: 4.1 mmol/L (ref 3.5–5.1)
Sodium: 137 mmol/L (ref 135–145)
Total Bilirubin: 1.9 mg/dL — ABNORMAL HIGH (ref 0.0–1.2)
Total Protein: 7.8 g/dL (ref 6.5–8.1)

## 2024-04-27 LAB — CBC
HCT: 44.9 % (ref 39.0–52.0)
Hemoglobin: 15.3 g/dL (ref 13.0–17.0)
MCH: 34.5 pg — ABNORMAL HIGH (ref 26.0–34.0)
MCHC: 34.1 g/dL (ref 30.0–36.0)
MCV: 101.1 fL — ABNORMAL HIGH (ref 80.0–100.0)
Platelets: 150 K/uL (ref 150–400)
RBC: 4.44 MIL/uL (ref 4.22–5.81)
RDW: 12.4 % (ref 11.5–15.5)
WBC: 3.3 K/uL — ABNORMAL LOW (ref 4.0–10.5)
nRBC: 0 % (ref 0.0–0.2)

## 2024-04-28 NOTE — Anesthesia Preprocedure Evaluation (Addendum)
 Anesthesia Evaluation  Patient identified by MRN, date of birth, ID band Patient awake    Reviewed: Allergy & Precautions, NPO status , Patient's Chart, lab work & pertinent test results  History of Anesthesia Complications Negative for: history of anesthetic complications  Airway Mallampati: I  TM Distance: >3 FB Neck ROM: Full    Dental  (+) Edentulous Upper, Edentulous Lower, Dental Advisory Given   Pulmonary neg shortness of breath, neg sleep apnea, neg COPD, neg recent URI, Current Smoker and Patient abstained from smoking.   breath sounds clear to auscultation       Cardiovascular hypertension, Pt. on medications  Rhythm:Regular   1. Prominent trabeculation noted, especially at apex, with mild global  hypokinesis with more prominent apical hypokinesis. Left ventricular  ejection fraction, by estimation, is 50 to 55%. The left ventricle has low  normal function. The left ventricle  demonstrates global hypokinesis. Left ventricular diastolic parameters  were normal.   2. Right ventricular systolic function is mildly reduced. The right  ventricular size is mildly enlarged.   3. Left atrial size was moderately dilated.   4. Right atrial size was moderately dilated.   5. The mitral valve is normal in structure. Trivial mitral valve  regurgitation. No evidence of mitral stenosis.   6. The tricuspid valve is abnormal.   7. The aortic valve is tricuspid. There is mild calcification of the  aortic valve. Aortic valve regurgitation is not visualized. No aortic  stenosis is present.   8. Mild pulmonic stenosis.   9. The inferior vena cava is dilated in size with >50% respiratory  variability, suggesting right atrial pressure of 8 mmHg.  10. Evidence of atrial level shunting detected by color flow Doppler.  There is a small patent foramen ovale with predominantly left to right  shunting across the atrial septum.      Neuro/Psych Residual balance issues CVA, Residual Symptoms    GI/Hepatic negative GI ROS,,,(+)     substance abuse  alcohol  useLab Results      Component                Value               Date                      ALT                      100 (H)             04/27/2024                AST                      134 (H)             04/27/2024                ALKPHOS                  129 (H)             04/27/2024                BILITOT                  1.9 (H)             04/27/2024  Endo/Other  negative endocrine ROS    Renal/GU negative Renal ROSLab Results      Component                Value               Date                      NA                       137                 04/27/2024                K                        4.1                 04/27/2024                CO2                      23                  04/27/2024                GLUCOSE                  76                  04/27/2024                BUN                      12                  04/27/2024                CREATININE               0.56 (L)            04/27/2024                CALCIUM                   9.5                 04/27/2024                EGFR                     86                  09/02/2023                GFRNONAA                 >60                 04/27/2024                Musculoskeletal  (+) Arthritis ,    Abdominal   Peds  Hematology negative hematology ROS (+) Lab Results      Component                Value  Date                      WBC                      3.3 (L)             04/27/2024                HGB                      15.3                04/27/2024                HCT                      44.9                04/27/2024                MCV                      101.1 (H)           04/27/2024                PLT                      150                 04/27/2024              Anesthesia Other Findings   Reproductive/Obstetrics                               Anesthesia Physical Anesthesia Plan  ASA: 3  Anesthesia Plan: General   Post-op Pain Management:    Induction: Intravenous  PONV Risk Score and Plan: 2 and Ondansetron  and Dexamethasone   Airway Management Planned: Oral ETT  Additional Equipment: ClearSight  Intra-op Plan:   Post-operative Plan: Extubation in OR  Informed Consent: I have reviewed the patients History and Physical, chart, labs and discussed the procedure including the risks, benefits and alternatives for the proposed anesthesia with the patient or authorized representative who has indicated his/her understanding and acceptance.     Dental advisory given  Plan Discussed with: CRNA  Anesthesia Plan Comments: (See PAT note from 8/11)         Anesthesia Quick Evaluation

## 2024-04-28 NOTE — Progress Notes (Signed)
 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  Patient follows with Cardiology for hx of congenital heart disease s/p repair in ~1960s. Per prior Cardiology notes in 2010 TEE showed positive bubble study.  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  ? prior ASD repair with residual leak (but no definite surgical material noted) versus small fenestrations in the atrial septum.  The bubbles do not cross at the typical PFO site.  The patient did have a stroke that was presumed to be embolic, ? due to R=>L connection. Patient was lost to follow up and then restablished care with Cardiology. He was seen on 03/25/24 for pre op clearance. He denied any cardiac symptoms. EKG was normal. Echo in 10/2023 showed normal LVEF with no significant valvular disease. He was cleared for surgery:  Chart reviewed as part of pre-operative protocol coverage. Given past medical history and time since last visit, based on ACC/AHA guidelines, Rodney Buchanan would be at acceptable risk for the planned procedure without further cardiovascular testing.  His RCRI is moderate risk, 6.6% risk of major cardiac event.  He is able to complete greater than 4 METS of physical activity.  Pt with hx of ETOH abuse and transaminitis noted on labs. CT A/P in March 2025  does not comment on any liver abnormality. Will add INR for DOS.  VS: BP 120/80 Comment: right arm sitting  Pulse 82   Temp 37.1 C (Oral)   Resp 16   Ht 5' 9 (1.753 m)   Wt 61.7 kg   SpO2 95%   BMI 20.08 kg/m   PROVIDERS: PCP - [employer] on [address].  Cardiologist - Darryle Decent, MD Neurology- Pastor Falling, MD  LABS:  (all labs ordered are listed, but only abnormal results are displayed)  Labs Reviewed  COMPREHENSIVE METABOLIC PANEL WITH GFR - Abnormal; Notable for the following components:      Result Value   Creatinine, Ser 0.56 (*)    AST 134 (*)    ALT 100 (*)    Alkaline Phosphatase 129 (*)    Total Bilirubin 1.9 (*)    All other components within normal limits  CBC - Abnormal; Notable for the following components:   WBC 3.3 (*)    MCV 101.1 (*)    MCH 34.5 (*)    All other components within normal limits  TYPE AND SCREEN     IMAGES:   EKG 03/25/24:  NSR, rate 78  CV:  Echo 11/12/23:  IMPRESSIONS     1. Prominent trabeculation noted, especially at apex, with mild global  hypokinesis with more prominent apical hypokinesis. Left ventricular  ejection fraction, by estimation, is 50 to 55%. The left ventricle has low  normal function. The left ventricle  demonstrates global hypokinesis.  Left ventricular diastolic parameters  were normal.   2. Right ventricular systolic function is mildly reduced. The right  ventricular size is mildly enlarged.   3. Left atrial size was moderately dilated.   4. Right atrial size was moderately dilated.   5. The mitral valve is normal in structure. Trivial mitral valve  regurgitation. No evidence of mitral stenosis.   6. The tricuspid valve is abnormal.   7. The aortic valve is tricuspid. There is mild calcification of the  aortic valve. Aortic valve regurgitation is not visualized. No aortic  stenosis is present.   8. Mild pulmonic stenosis.   9. The inferior vena cava is dilated in size with >50%  respiratory  variability, suggesting right atrial pressure of 8 mmHg.  10. Evidence of atrial level shunting detected by color flow Doppler.  There is a small patent foramen ovale with predominantly left to right  shunting across the atrial septum.   Comparison(s): No prior Echocardiogram. MRI 03/10/2009 EF 60%.   Conclusion(s)/Recommendation(s): Low normal LV function, prominent LV  trabeculation, mild diffuse hypokinesis with more prominent hypokinesis  and trabeculation at the apex.   Cardiac MRI 03/10/2009:  IMPRESSION: 1. Moderately dilated RV with normal systolic function.   2.  Moderate right atrial enlargement.   3.  There appears to be flow across the interatrial septum that does not seem to be due to a PFO.  There may be a small ASD or several small fenestrations in the interatrial septum leading to left to right flow.  A discrete defect could not be definitively localized.  The patient had an unknown cardiac surgery as a child. I do not see definite evidence for patch repair of an ASD.   4.  The pulmonary veins by MR angiography appear to have normal connections though on FIESTA images the right lower pulmonary vein appears to join the lower right atrium.  Past Medical History:  Diagnosis Date   Arthritis    ETOH abuse    Heart murmur    had valve surgery at age 62 in [place]   Hypertension    Smoker    Stroke [facility])    x7    Past Surgical History:  Procedure Laterality Date   BACK SURGERY     Lumbar surgery done d/t fall off a scaffold in [facility]   CARDIAC SURGERY  1968   Murmur Repair netty repair in [place]   HERNIA REPAIR Right    done in [place]    MEDICATIONS:  amLODipine  (NORVASC ) 5 MG tablet   atorvastatin  (LIPITOR) 20 MG tablet   solifenacin (VESICARE) 5 MG tablet   No current facility-administered medications for this encounter.   Rodney Buchanan Surgical Short Stay/Anesthesiology [place] [facility] Phone [phone] 04/28/2024 9:24 AM

## 2024-04-29 ENCOUNTER — Other Ambulatory Visit: Payer: Self-pay

## 2024-04-29 ENCOUNTER — Observation Stay (HOSPITAL_COMMUNITY)
Admission: RE | Admit: 2024-04-29 | Discharge: 2024-05-01 | Disposition: A | Discharge status: Home / Self Care | Attending: Urology | Admitting: Urology

## 2024-04-29 ENCOUNTER — Encounter (HOSPITAL_COMMUNITY)
Admission: RE | Disposition: A | Discharge status: Home / Self Care | Payer: Self-pay | Source: Home / Self Care | Attending: Urology

## 2024-04-29 ENCOUNTER — Encounter (HOSPITAL_COMMUNITY): Payer: Self-pay | Admitting: Medical

## 2024-04-29 ENCOUNTER — Ambulatory Visit (HOSPITAL_BASED_OUTPATIENT_CLINIC_OR_DEPARTMENT_OTHER): Payer: Self-pay | Admitting: Medical

## 2024-04-29 ENCOUNTER — Encounter (HOSPITAL_COMMUNITY): Payer: Self-pay | Admitting: Urology

## 2024-04-29 DIAGNOSIS — C61 Malignant neoplasm of prostate: Principal | ICD-10-CM | POA: Diagnosis present

## 2024-04-29 DIAGNOSIS — F17210 Nicotine dependence, cigarettes, uncomplicated: Secondary | ICD-10-CM

## 2024-04-29 DIAGNOSIS — I679 Cerebrovascular disease, unspecified: Secondary | ICD-10-CM | POA: Insufficient documentation

## 2024-04-29 DIAGNOSIS — I10 Essential (primary) hypertension: Secondary | ICD-10-CM | POA: Insufficient documentation

## 2024-04-29 DIAGNOSIS — K4090 Unilateral inguinal hernia, without obstruction or gangrene, not specified as recurrent: Secondary | ICD-10-CM | POA: Diagnosis not present

## 2024-04-29 DIAGNOSIS — F10129 Alcohol abuse with intoxication, unspecified: Principal | ICD-10-CM

## 2024-04-29 DIAGNOSIS — N400 Enlarged prostate without lower urinary tract symptoms: Secondary | ICD-10-CM | POA: Diagnosis not present

## 2024-04-29 DIAGNOSIS — Z79899 Other long term (current) drug therapy: Secondary | ICD-10-CM | POA: Diagnosis not present

## 2024-04-29 HISTORY — PX: ROBOT ASSISTED LAPAROSCOPIC RADICAL PROSTATECTOMY: SHX5141

## 2024-04-29 HISTORY — PX: INGUINAL HERNIA REPAIR: SHX194

## 2024-04-29 LAB — ABO/RH: ABO/RH(D): B NEG

## 2024-04-29 LAB — TYPE AND SCREEN
ABO/RH(D): B NEG
Antibody Screen: NEGATIVE

## 2024-04-29 LAB — HEMOGLOBIN AND HEMATOCRIT, BLOOD
HCT: 40.8 % (ref 39.0–52.0)
Hemoglobin: 14.3 g/dL (ref 13.0–17.0)

## 2024-04-29 LAB — PROTIME-INR
INR: 0.9 (ref 0.8–1.2)
Prothrombin Time: 12.8 s (ref 11.4–15.2)

## 2024-04-29 SURGERY — PROSTATECTOMY, RADICAL, ROBOT-ASSISTED, LAPAROSCOPIC
Anesthesia: General | Site: Abdomen

## 2024-04-29 MED ORDER — LIDOCAINE HCL (PF) 2 % IJ SOLN
INTRAMUSCULAR | Status: AC
  Filled 2024-04-29: qty 5

## 2024-04-29 MED ORDER — FENTANYL CITRATE PF 50 MCG/ML IJ SOSY
PREFILLED_SYRINGE | INTRAMUSCULAR | Status: AC
  Filled 2024-04-29: qty 3

## 2024-04-29 MED ORDER — DEXAMETHASONE SODIUM PHOSPHATE 10 MG/ML IJ SOLN
INTRAMUSCULAR | Status: AC
  Filled 2024-04-29: qty 1

## 2024-04-29 MED ORDER — ONDANSETRON HCL 4 MG/2ML IJ SOLN
4.0000 mg | INTRAMUSCULAR | Status: DC | PRN
  Administered 2024-04-29 (×2): 4 mg via INTRAVENOUS
  Filled 2024-04-29: qty 2

## 2024-04-29 MED ORDER — HYDROMORPHONE HCL 1 MG/ML IJ SOLN: 0.5000 mg | INTRAMUSCULAR | Status: DC | PRN

## 2024-04-29 MED ORDER — SPIRITUS FRUMENTI
1.0000 | Freq: Three times a day (TID) | ORAL | Status: DC
  Administered 2024-04-29 (×2): 1 via ORAL
  Filled 2024-04-29 (×8): qty 1

## 2024-04-29 MED ORDER — FENTANYL CITRATE PF 50 MCG/ML IJ SOSY
25.0000 ug | PREFILLED_SYRINGE | INTRAMUSCULAR | Status: DC | PRN
  Administered 2024-04-29 (×4): 25 ug via INTRAVENOUS

## 2024-04-29 MED ORDER — ROCURONIUM BROMIDE 10 MG/ML (PF) SYRINGE
PREFILLED_SYRINGE | INTRAVENOUS | Status: DC | PRN
  Administered 2024-04-29 (×2): 70 mg via INTRAVENOUS

## 2024-04-29 MED ORDER — MAGNESIUM CITRATE PO SOLN: 1.0000 | Freq: Once | ORAL | Status: DC

## 2024-04-29 MED ORDER — ONDANSETRON HCL 4 MG/2ML IJ SOLN
INTRAMUSCULAR | Status: AC
  Filled 2024-04-29: qty 2

## 2024-04-29 MED ORDER — SODIUM CHLORIDE (PF) 0.9 % IJ SOLN
INTRAMUSCULAR | Status: AC
  Filled 2024-04-29: qty 20

## 2024-04-29 MED ORDER — ONDANSETRON HCL 4 MG/2ML IJ SOLN
INTRAMUSCULAR | Status: DC | PRN
  Administered 2024-04-29 (×2): 4 mg via INTRAVENOUS

## 2024-04-29 MED ORDER — AMLODIPINE BESYLATE 10 MG PO TABS
5.0000 mg | ORAL_TABLET | Freq: Every day | ORAL | Status: DC
  Administered 2024-04-30 – 2024-05-01 (×2): 5 mg via ORAL
  Filled 2024-04-29 (×2): qty 1

## 2024-04-29 MED ORDER — SUGAMMADEX SODIUM 200 MG/2ML IV SOLN
INTRAVENOUS | Status: DC | PRN
  Administered 2024-04-29 (×4): 200 mg via INTRAVENOUS

## 2024-04-29 MED ORDER — FENTANYL CITRATE (PF) 100 MCG/2ML IJ SOLN
INTRAMUSCULAR | Status: DC | PRN
  Administered 2024-04-29 (×3): 50 ug via INTRAVENOUS
  Administered 2024-04-29: 100 ug via INTRAVENOUS
  Administered 2024-04-29: 50 ug via INTRAVENOUS
  Administered 2024-04-29: 100 ug via INTRAVENOUS

## 2024-04-29 MED ORDER — ATORVASTATIN CALCIUM 20 MG PO TABS
20.0000 mg | ORAL_TABLET | Freq: Every day | ORAL | Status: DC
  Administered 2024-04-30 – 2024-05-01 (×2): 20 mg via ORAL
  Filled 2024-04-29 (×2): qty 1

## 2024-04-29 MED ORDER — ORAL CARE MOUTH RINSE: 15.0000 mL | Freq: Once | OROMUCOSAL | Status: AC

## 2024-04-29 MED ORDER — TRIPLE ANTIBIOTIC 3.5-400-5000 EX OINT: 1.0000 | TOPICAL_OINTMENT | Freq: Three times a day (TID) | CUTANEOUS | Status: DC | PRN

## 2024-04-29 MED ORDER — LACTATED RINGERS IR SOLN
Status: DC | PRN
  Administered 2024-04-29 (×2): 1000 mL

## 2024-04-29 MED ORDER — CEFAZOLIN SODIUM-DEXTROSE 2-4 GM/100ML-% IV SOLN
2.0000 g | INTRAVENOUS | Status: AC
  Administered 2024-04-29 (×2): 2 g via INTRAVENOUS
  Filled 2024-04-29: qty 100

## 2024-04-29 MED ORDER — HYOSCYAMINE SULFATE 0.125 MG SL SUBL: 0.1250 mg | SUBLINGUAL_TABLET | SUBLINGUAL | Status: DC | PRN

## 2024-04-29 MED ORDER — SULFAMETHOXAZOLE-TRIMETHOPRIM 800-160 MG PO TABS: 1.0000 | ORAL_TABLET | Freq: Two times a day (BID) | ORAL | 0 refills | Status: AC

## 2024-04-29 MED ORDER — OXYCODONE HCL 5 MG PO TABS: 5.0000 mg | ORAL_TABLET | Freq: Once | ORAL | Status: DC | PRN

## 2024-04-29 MED ORDER — SODIUM CHLORIDE (PF) 0.9 % IJ SOLN
INTRAMUSCULAR | Status: DC | PRN
  Administered 2024-04-29 (×2): 20 mL

## 2024-04-29 MED ORDER — DOCUSATE SODIUM 100 MG PO CAPS
100.0000 mg | ORAL_CAPSULE | Freq: Two times a day (BID) | ORAL | Status: AC
Start: 2024-04-29 — End: ?

## 2024-04-29 MED ORDER — OXYCODONE HCL 5 MG/5ML PO SOLN: 5.0000 mg | Freq: Once | ORAL | Status: DC | PRN

## 2024-04-29 MED ORDER — MIDAZOLAM HCL 2 MG/2ML IJ SOLN
INTRAMUSCULAR | Status: AC
  Filled 2024-04-29: qty 2

## 2024-04-29 MED ORDER — AMISULPRIDE (ANTIEMETIC) 5 MG/2ML IV SOLN
10.0000 mg | Freq: Once | INTRAVENOUS | Status: AC
  Administered 2024-04-29 (×2): 10 mg via INTRAVENOUS

## 2024-04-29 MED ORDER — CHLORHEXIDINE GLUCONATE 0.12 % MT SOLN
15.0000 mL | Freq: Once | OROMUCOSAL | Status: AC
  Administered 2024-04-29 (×2): 15 mL via OROMUCOSAL

## 2024-04-29 MED ORDER — DEXAMETHASONE SODIUM PHOSPHATE 10 MG/ML IJ SOLN
INTRAMUSCULAR | Status: DC | PRN
  Administered 2024-04-29 (×2): 10 mg via INTRAVENOUS

## 2024-04-29 MED ORDER — STERILE WATER FOR IRRIGATION IR SOLN
Status: DC | PRN
  Administered 2024-04-29 (×2): 1000 mL

## 2024-04-29 MED ORDER — DOCUSATE SODIUM 100 MG PO CAPS
100.0000 mg | ORAL_CAPSULE | Freq: Two times a day (BID) | ORAL | Status: DC
  Administered 2024-04-29 – 2024-05-01 (×5): 100 mg via ORAL
  Filled 2024-04-29 (×4): qty 1

## 2024-04-29 MED ORDER — PHENYLEPHRINE 80 MCG/ML (10ML) SYRINGE FOR IV PUSH (FOR BLOOD PRESSURE SUPPORT)
PREFILLED_SYRINGE | INTRAVENOUS | Status: DC | PRN
  Administered 2024-04-29 (×2): 160 ug via INTRAVENOUS

## 2024-04-29 MED ORDER — AMISULPRIDE (ANTIEMETIC) 5 MG/2ML IV SOLN
INTRAVENOUS | Status: AC
  Filled 2024-04-29: qty 4

## 2024-04-29 MED ORDER — BUPIVACAINE LIPOSOME 1.3 % IJ SUSP
INTRAMUSCULAR | Status: DC | PRN
  Administered 2024-04-29 (×2): 20 mL

## 2024-04-29 MED ORDER — LACTATED RINGERS IV SOLN: INTRAVENOUS | Status: DC

## 2024-04-29 MED ORDER — PROPOFOL 10 MG/ML IV BOLUS
INTRAVENOUS | Status: DC | PRN
  Administered 2024-04-29: 20 mg via INTRAVENOUS
  Administered 2024-04-29 (×2): 140 mg via INTRAVENOUS
  Administered 2024-04-29: 20 mg via INTRAVENOUS

## 2024-04-29 MED ORDER — SODIUM CHLORIDE 0.9 % IV SOLN: INTRAVENOUS | Status: DC

## 2024-04-29 MED ORDER — DIPHENHYDRAMINE HCL 50 MG/ML IJ SOLN: 12.5000 mg | Freq: Four times a day (QID) | INTRAMUSCULAR | Status: DC | PRN

## 2024-04-29 MED ORDER — PROPOFOL 10 MG/ML IV BOLUS
INTRAVENOUS | Status: AC
  Filled 2024-04-29: qty 20

## 2024-04-29 MED ORDER — HYDROCODONE-ACETAMINOPHEN 5-325 MG PO TABS: 1.0000 | ORAL_TABLET | Freq: Four times a day (QID) | ORAL | 0 refills | Status: AC | PRN

## 2024-04-29 MED ORDER — SODIUM CHLORIDE 0.9 % IV BOLUS
1000.0000 mL | Freq: Once | INTRAVENOUS | Status: AC
  Administered 2024-04-29 (×2): 1000 mL via INTRAVENOUS

## 2024-04-29 MED ORDER — FENTANYL CITRATE (PF) 100 MCG/2ML IJ SOLN
INTRAMUSCULAR | Status: AC
  Filled 2024-04-29: qty 2

## 2024-04-29 MED ORDER — ACETAMINOPHEN 500 MG PO TABS
1000.0000 mg | ORAL_TABLET | Freq: Four times a day (QID) | ORAL | Status: AC
  Administered 2024-04-29 – 2024-04-30 (×3): 1000 mg via ORAL
  Filled 2024-04-29 (×3): qty 2

## 2024-04-29 MED ORDER — DIPHENHYDRAMINE HCL 12.5 MG/5ML PO ELIX: 12.5000 mg | ORAL_SOLUTION | Freq: Four times a day (QID) | ORAL | Status: DC | PRN

## 2024-04-29 MED ORDER — ROCURONIUM BROMIDE 10 MG/ML (PF) SYRINGE
PREFILLED_SYRINGE | INTRAVENOUS | Status: AC
  Filled 2024-04-29: qty 10

## 2024-04-29 MED ORDER — BUPIVACAINE LIPOSOME 1.3 % IJ SUSP
INTRAMUSCULAR | Status: AC
  Filled 2024-04-29: qty 20

## 2024-04-29 MED ORDER — LIDOCAINE HCL (PF) 2 % IJ SOLN
INTRAMUSCULAR | Status: DC | PRN
Start: 2024-04-29 — End: 2024-04-29
  Administered 2024-04-29 (×2): 100 mg via INTRADERMAL

## 2024-04-29 MED ORDER — OXYCODONE HCL 5 MG PO TABS
5.0000 mg | ORAL_TABLET | ORAL | Status: DC | PRN
  Administered 2024-04-29 – 2024-05-01 (×8): 5 mg via ORAL
  Filled 2024-04-29 (×6): qty 1

## 2024-04-29 MED ORDER — FENTANYL CITRATE (PF) 100 MCG/2ML IJ SOLN
INTRAMUSCULAR | Status: AC
Start: 2024-04-29 — End: 2024-04-29
  Filled 2024-04-29: qty 2

## 2024-04-29 MED ORDER — SUGAMMADEX SODIUM 200 MG/2ML IV SOLN
INTRAVENOUS | Status: AC
  Filled 2024-04-29: qty 2

## 2024-04-29 MED ORDER — MIDAZOLAM HCL 5 MG/5ML IJ SOLN
INTRAMUSCULAR | Status: DC | PRN
  Administered 2024-04-29 (×2): 2 mg via INTRAVENOUS

## 2024-04-29 SURGICAL SUPPLY — 59 items
APPLICATOR COTTON TIP 6 STRL (MISCELLANEOUS) ×2 IMPLANT
BAG COUNTER SPONGE SURGICOUNT (BAG) IMPLANT
CATH FOLEY 2WAY SLVR 18FR 30CC (CATHETERS) ×2 IMPLANT
CATH TIEMANN FOLEY 18FR 5CC (CATHETERS) ×2 IMPLANT
CHLORAPREP W/TINT 26 (MISCELLANEOUS) ×2 IMPLANT
CLIP LIGATING HEM O LOK PURPLE (MISCELLANEOUS) ×4 IMPLANT
CNTNR URN SCR LID CUP LEK RST (MISCELLANEOUS) ×2 IMPLANT
COVER SURGICAL LIGHT HANDLE (MISCELLANEOUS) ×2 IMPLANT
COVER TIP SHEARS 8 DVNC (MISCELLANEOUS) ×2 IMPLANT
CUTTER ECHEON FLEX ENDO 45 340 (ENDOMECHANICALS) ×2 IMPLANT
DERMABOND ADVANCED .7 DNX12 (GAUZE/BANDAGES/DRESSINGS) ×2 IMPLANT
DRAPE ARM DVNC X/XI (DISPOSABLE) ×8 IMPLANT
DRAPE COLUMN DVNC XI (DISPOSABLE) ×2 IMPLANT
DRAPE SURG IRRIG POUCH 19X23 (DRAPES) ×2 IMPLANT
DRIVER NDL LRG 8 DVNC XI (INSTRUMENTS) ×4 IMPLANT
DRIVER NDLE LRG 8 DVNC XI (INSTRUMENTS) ×4 IMPLANT
DRSG TEGADERM 4X4.75 (GAUZE/BANDAGES/DRESSINGS) ×2 IMPLANT
ELECT PENCIL ROCKER SW 15FT (MISCELLANEOUS) ×2 IMPLANT
ELECT REM PT RETURN 15FT ADLT (MISCELLANEOUS) ×2 IMPLANT
FORCEPS BPLR LNG DVNC XI (INSTRUMENTS) ×2 IMPLANT
FORCEPS PROGRASP DVNC XI (FORCEP) ×2 IMPLANT
GAUZE 4X4 16PLY ~~LOC~~+RFID DBL (SPONGE) IMPLANT
GAUZE SPONGE 2X2 8PLY STRL LF (GAUZE/BANDAGES/DRESSINGS) IMPLANT
GAUZE SPONGE 4X4 12PLY STRL (GAUZE/BANDAGES/DRESSINGS) ×2 IMPLANT
GLOVE BIO SURGEON STRL SZ 6.5 (GLOVE) ×2 IMPLANT
GLOVE BIOGEL PI IND STRL 7.5 (GLOVE) ×2 IMPLANT
GLOVE SURG LX STRL 7.5 STRW (GLOVE) ×4 IMPLANT
GOWN STRL REUS W/ TWL XL LVL3 (GOWN DISPOSABLE) ×4 IMPLANT
GOWN STRL SURGICAL XL XLNG (GOWN DISPOSABLE) ×2 IMPLANT
HOLDER FOLEY CATH W/STRAP (MISCELLANEOUS) ×2 IMPLANT
IRRIGATION SUCT STRKRFLW 2 WTP (MISCELLANEOUS) ×2 IMPLANT
IV LACTATED RINGERS 1000ML (IV SOLUTION) ×2 IMPLANT
KIT PROCEDURE DVNC SI (MISCELLANEOUS) ×2 IMPLANT
KIT TURNOVER KIT A (KITS) ×2 IMPLANT
NDL INSUFFLATION 14GA 120MM (NEEDLE) ×2 IMPLANT
NDL SPNL 22GX7 QUINCKE BK (NEEDLE) ×2 IMPLANT
NEEDLE INSUFFLATION 14GA 120MM (NEEDLE) ×2 IMPLANT
NEEDLE SPNL 22GX7 QUINCKE BK (NEEDLE) ×2 IMPLANT
PACK ROBOT UROLOGY CUSTOM (CUSTOM PROCEDURE TRAY) ×2 IMPLANT
PAD POSITIONING PINK XL (MISCELLANEOUS) ×2 IMPLANT
PLUG CATH AND CAP STRL 200 (CATHETERS) ×2 IMPLANT
PORT ACCESS TROCAR AIRSEAL 12 (TROCAR) ×2 IMPLANT
RELOAD STAPLE 45 4.1 GRN THCK (STAPLE) ×2 IMPLANT
SCISSORS MNPLR CVD DVNC XI (INSTRUMENTS) ×2 IMPLANT
SEAL UNIV 5-12 XI (MISCELLANEOUS) ×8 IMPLANT
SET TRI-LUMEN FLTR TB AIRSEAL (TUBING) ×2 IMPLANT
SOL PREP POV-IOD 4OZ 10% (MISCELLANEOUS) ×2 IMPLANT
SOLUTION ELECTROSURG ANTI STCK (MISCELLANEOUS) ×2 IMPLANT
SPIKE FLUID TRANSFER (MISCELLANEOUS) ×2 IMPLANT
SPONGE T-LAP 4X18 ~~LOC~~+RFID (SPONGE) ×2 IMPLANT
SUT ETHIBOND 0 (SUTURE) IMPLANT
SUT ETHILON 3 0 PS 1 (SUTURE) ×2 IMPLANT
SUT MNCRL AB 4-0 PS2 18 (SUTURE) ×4 IMPLANT
SUT PDS AB 1 CT1 27 (SUTURE) ×4 IMPLANT
SUT VIC AB 2-0 SH 27X BRD (SUTURE) ×2 IMPLANT
SUT VICRYL 0 UR6 27IN ABS (SUTURE) ×2 IMPLANT
SUTURE VLOC BRB 180 ABS3/0GR12 (SUTURE) IMPLANT
SYR 27GX1/2 1ML LL SAFETY (SYRINGE) ×2 IMPLANT
WATER STERILE IRR 1000ML POUR (IV SOLUTION) ×2 IMPLANT

## 2024-04-29 NOTE — Discharge Instructions (Addendum)
 1- Drain Sites - You may have some mild persistent drainage from old drain site for several days, this is normal. This can be covered with cotton gauze for convenience.  2 - Stiches - Your stitches are all dissolvable. You may notice a "loose thread" at your incisions, these are normal and require no intervention. You may cut them flush to the skin with fingernail clippers if needed for comfort.  3 - Diet - No restrictions  4 - Activity - No heavy lifting / straining (any activities that require valsalva or "bearing down") x 4 weeks. Otherwise, no restrictions.  5 - Bathing - You may shower immediately. Do not take a bath or get into swimming pool where incision sites are submersed in water x 4 weeks.   6 - Catheter - Will remain in place until removed at your next appointment. It may be cleaned with soap and water in the shower. It may be disconnected from the drain bag while in the shower to avoid tripping over the tube. You may apply Neosporin or Vaseline ointment as needed to the tip of the penis where the catheter inserts to reduce friction and irritation in this spot.   7 - When to Call the Doctor - Call MD for any fever >102, any acute wound problems, or any severe nausea / vomiting. You can call the [facility] [phone]) 24 hours a day 365 days a year. It will roll-over to the answering service and on-call physician after hours.   You may resume aspirin, advil, aleve, vitamins, and supplements 7 days after surgery.

## 2024-04-29 NOTE — H&P (Signed)
 Rodney Buchanan is an 63 y.o. male.    Chief Complaint: Pre-Op Prostatectomy and LEFT Inguinal Hernia Repair  HPI:   1 - HIgh Risk Prostate Cancer - 5-12 cores up to 40% grade 1 and 2 cancer on BX 08/2023. PSA 23. CT and BS localized. Volume 66mL with small median.   2 - Left Inguinal Hernia - about 3cm defect left inguinal hernia with some bowel contents on CT 2025. No hy/o strangulation   3 - Lower Urinary Tract Symptoms - on solifenacin at baseline for mostly irritative symptoms. Prostae 66mL as per above.   PMH sig for PAD/Aortoiliac grafts (not limiting), CVA (no deficits whatsoever), Heart valve surgery in 1970s (follows Rodney Decent MD cards), >50PY smoker/still smokes, Some heavy drinking. Works in Chi[employer]. His wife Rodney Buchanan is very involved.   Today  Rodney Buchanan  is seen to proceed with prostatectomy / node dissection / left inguinal hernia repari. NO interval fevers. Hgb 15, Cr 0.56 most recently  Past Medical History:  Diagnosis Date   Arthritis    ETOH abuse    Heart murmur    had valve surgery at age 74 in [place]   Hypertension    Smoker    Stroke [facility])    x7    Past Surgical History:  Procedure Laterality Date   BACK SURGERY     Lumbar surgery done d/t fall off a scaffold in [facility]   CARDIAC SURGERY  1968   Murmur Repair netty repair in [place]   HERNIA REPAIR Right    done in [place]    Family History  Problem Relation Age of Onset   Stroke Mother    Social History:  reports that he has been smoking cigarettes. He started smoking about 53 years ago. He has a 53.7 pack-year smoking history. He has never used smokeless tobacco. He reports current alcohol  use of about 28.0 standard drinks of alcohol  per week. He reports that he does not currently use drugs.  Allergies: No Known Allergies  Medications Prior to Admission  Medication Sig Dispense Refill   amLODipine  (NORVASC ) 5 MG tablet TAKE 1 TABLET BY MOUTH DAILY 180 tablet 2    atorvastatin  (LIPITOR) 20 MG tablet Take 1 tablet (20 mg total) by mouth daily. 90 tablet 3   solifenacin (VESICARE) 5 MG tablet Take 5 mg by mouth daily.      Results for orders placed or performed during the hospital encounter of 04/27/24 (from the past 48 hours)  Comprehensive metabolic panel per protocol     Status: Abnormal   Collection Time: 04/27/24  2:38 PM  Result Value Ref Range   Sodium 137 135 - 145 mmol/L   Potassium 4.1 3.5 - 5.1 mmol/L   Chloride 101 98 - 111 mmol/L   CO2 23 22 - 32 mmol/L   Glucose, Bld 76 70 - 99 mg/dL    Comment: Glucose reference range applies only to samples taken after fasting for at least 8 hours.   BUN 12 8 - 23 mg/dL   Creatinine, Ser 9.43 (L) 0.61 - 1.24 mg/dL   Calcium  9.5 8.9 - 10.3 mg/dL   Total Protein 7.8 6.5 - 8.1 g/dL   Albumin 4.2 3.5 - 5.0 g/dL   AST 865 (H) 15 - 41 U/L   ALT 100 (H) 0 - 44 U/L   Alkaline Phosphatase 129 (H) 38 - 126 U/L   Total Bilirubin 1.9 (H) 0.0 - 1.2 mg/dL   GFR, Estimated >39 >  60 mL/min    Comment: (NOTE) Calculated using the CKD-EPI Creatinine Equation (2021)    Anion gap 13 5 - 15    Comment: Performed at [facility], [address] [place], [place] [address]  CBC per protocol     Status: Abnormal   Collection Time: 04/27/24  2:38 PM  Result Value Ref Range   WBC 3.3 (L) 4.0 - 10.5 K/uL   RBC 4.44 4.22 - 5.81 MIL/uL   Hemoglobin 15.3 13.0 - 17.0 g/dL   HCT 55.0 60.9 - 47.9 %   MCV 101.1 (H) 80.0 - 100.0 fL   MCH 34.5 (H) 26.0 - 34.0 pg   MCHC 34.1 30.0 - 36.0 g/dL   RDW 87.5 88.4 - 84.4 %   Platelets 150 150 - 400 K/uL   nRBC 0.0 0.0 - 0.2 %    Comment: Performed at [facility], [address], [place], [place] [address]  Type and screen     Status: None   Collection Time: 04/27/24  2:38 PM  Result Value Ref Range   ABO/RH(D) B NEG    Antibody Screen NEG    Sample Expiration      05/11/2024,2359 Performed at [facility], [address] [place], [place] [address]    No results found.  Review of Systems  Constitutional:  Positive for fever. Negative for chills.  All other systems reviewed and are negative.   Blood pressure (!) 148/94, pulse 62, temperature 97.7 F (36.5 C), temperature source Oral, resp. rate 16, height 5' 9 (1.753 m), weight 61.7 kg, SpO2 94%. Physical Exam Vitals reviewed.  HENT:     Head: Normocephalic.  Eyes:     Pupils: Pupils are equal, round, and reactive to light.  Cardiovascular:     Rate and Rhythm: Normal rate.  Pulmonary:     Effort: Pulmonary effort is normal.  Abdominal:     General: Abdomen is flat.  Genitourinary:    Comments: No CVAT at present Musculoskeletal:        General: Normal range of motion.     Cervical back: Normal range of motion.  Skin:    General: Skin is warm.  Neurological:     General: No focal deficit present.     Mental Status: He is alert.  Psychiatric:        Mood and Affect: Mood normal.      Assessment/Plan  Proceed as planned with prostatectmy / node dissection / left inguinal hernia repair. Risks, benefits, alternatives, expected peri-op course discussed previously and reiterated today. He understands that vascular comorbidity and alcohol  dependance increases risk of peri-op complications.   Ricardo KATHEE Alvaro Mickey., MD 04/29/2024, 6:39 AM

## 2024-04-29 NOTE — Anesthesia Procedure Notes (Addendum)
 Procedure Name: Intubation Date/Time: 04/29/2024 8:37 AM  Performed by: Carleton Garnette SAUNDERS, CRNAPre-anesthesia Checklist: Patient identified, Suction available, Emergency Drugs available, Patient being monitored and Timeout performed Patient Re-evaluated:Patient Re-evaluated prior to induction Oxygen Delivery Method: Circle system utilized Preoxygenation: Pre-oxygenation with 100% oxygen Induction Type: IV induction Ventilation: Mask ventilation without difficulty Laryngoscope Size: Mac and 4 Grade View: Grade I Tube type: Oral Tube size: 7.5 mm Number of attempts: 1 Airway Equipment and Method: Stylet Placement Confirmation: ETT inserted through vocal cords under direct vision, positive ETCO2 and breath sounds checked- equal and bilateral Secured at: 22 cm Tube secured with: Tape Dental Injury: Teeth and Oropharynx as per pre-operative assessment

## 2024-04-29 NOTE — Anesthesia Procedure Notes (Signed)
 Arterial Line Insertion Start/End8/13/2025 8:48 AM, 04/29/2024 8:58 AM Performed by: Leopoldo Bruckner, MD, anesthesiologist  Patient location: OR. Preanesthetic checklist: patient identified, IV checked, site marked, risks and benefits discussed, surgical consent, monitors and equipment checked, pre-op evaluation, timeout performed and anesthesia consent Patient sedated Right, radial was placed Catheter size: 20 G Hand hygiene performed  and maximum sterile barriers used   Attempts: 1 Procedure performed using ultrasound guided technique. Ultrasound Notes:anatomy identified, needle tip was noted to be adjacent to the nerve/plexus identified and no ultrasound evidence of intravascular and/or intraneural injection Following insertion, dressing applied and Biopatch. Post procedure assessment: normal and unchanged  Post procedure complications: second provider assisted and unsuccessful attempts. Patient tolerated the procedure well with no immediate complications. Additional procedure comments: US  image not obtained.

## 2024-04-29 NOTE — Plan of Care (Signed)
  Problem: Pain Management: Goal: General experience of comfort will improve Outcome: Progressing   Problem: Health Behavior/Discharge Planning: Goal: Ability to manage health-related needs will improve Outcome: Progressing   Problem: Clinical Measurements: Goal: Ability to maintain clinical measurements within normal limits will improve Outcome: Progressing   Problem: Activity: Goal: Risk for activity intolerance will decrease Outcome: Progressing

## 2024-04-29 NOTE — Anesthesia Postprocedure Evaluation (Signed)
 Anesthesia Post Note  Patient: Rodney Buchanan  Procedure(s) Performed: PROSTATECTOMY, RADICAL, ROBOT-ASSISTED, LAPAROSCOPIC (Abdomen) LYMPHADENECTOMY, PELVIS, ROBOT-ASSISTED (Abdomen) REPAIR, HERNIA, INGUINAL, LAPAROSCOPIC (Left: Abdomen)     Patient location during evaluation: PACU Anesthesia Type: General Level of consciousness: awake and alert Pain management: pain level controlled Vital Signs Assessment: post-procedure vital signs reviewed and stable Respiratory status: spontaneous breathing, nonlabored ventilation and respiratory function stable Cardiovascular status: blood pressure returned to baseline and stable Postop Assessment: no apparent nausea or vomiting Anesthetic complications: no   No notable events documented.                 Raveena Hebdon

## 2024-04-29 NOTE — Brief Op Note (Signed)
 04/29/2024  11:47 AM  PATIENT:  Rodney Buchanan  63 y.o. male  PRE-OPERATIVE DIAGNOSIS:  PROSTATE CANCER, LEFT INGUINAL HERNIA  POST-OPERATIVE DIAGNOSIS:  PROSTATE CANCER, LEFT INGUINAL HERNIA  PROCEDURE:  Procedure(s): PROSTATECTOMY, RADICAL, ROBOT-ASSISTED, LAPAROSCOPIC (N/A) LYMPHADENECTOMY, PELVIS, ROBOT-ASSISTED (N/A) REPAIR, HERNIA, INGUINAL, LAPAROSCOPIC (Left)  SURGEON:  Surgeons and Role:    * Manny, Ricardo KATHEE Raddle., MD - Primary  PHYSICIAN ASSISTANT:   ASSISTANTS: Alan Hammonds PA   ANESTHESIA:   local and general  EBL:  130 mL   BLOOD ADMINISTERED:none  DRAINS: 1 - JP to bulb; 2- FOley to gravity   LOCAL MEDICATIONS USED:  MARCAINE      SPECIMEN:  Source of Specimen:  1 - periprostatic fat; 2- pelvic lymph nodes; 3 - prostatedctomy  DISPOSITION OF SPECIMEN:  PATHOLOGY  COUNTS:  YES  TOURNIQUET:  * No tourniquets in log *  DICTATION: .Other Dictation: Dictation Number [id number]  PLAN OF CARE: Admit for overnight observation  PATIENT DISPOSITION:  PACU - hemodynamically stable.   Delay start of Pharmacological VTE agent (>24hrs) due to surgical blood loss or risk of bleeding: yes

## 2024-04-29 NOTE — Anesthesia Procedure Notes (Signed)
 Arterial Line Insertion Start/End8/13/2025 8:45 AM, 04/29/2024 8:56 AM Performed by: Leopoldo Bruckner, MD, Williford, Peggy D, CRNA, anesthesiologist  Patient location: OR. Preanesthetic checklist: patient identified, IV checked, site marked, risks and benefits discussed, surgical consent, monitors and equipment checked, pre-op evaluation and timeout performed Right, radial was placed Catheter size: 20 G Hand hygiene performed   Attempts: 2 Procedure performed using ultrasound guided technique. Ultrasound Notes:anatomy identified and needle tip was noted to be adjacent to the nerve/plexus identified Following insertion, Biopatch and dressing applied. Post procedure assessment: normal  Patient tolerated the procedure well with no immediate complications. Additional procedure comments: A line per Dr. Leopoldo with ultrasound to R radial..

## 2024-04-29 NOTE — Transfer of Care (Signed)
 Immediate Anesthesia Transfer of Care Note  Patient: Rodney Buchanan  Procedure(s) Performed: PROSTATECTOMY, RADICAL, ROBOT-ASSISTED, LAPAROSCOPIC (Abdomen) LYMPHADENECTOMY, PELVIS, ROBOT-ASSISTED (Abdomen) REPAIR, HERNIA, INGUINAL, LAPAROSCOPIC (Left: Abdomen)  Patient Location: PACU  Anesthesia Type:General  Level of Consciousness: sedated  Airway & Oxygen Therapy: Patient Spontanous Breathing and Patient connected to face mask oxygen  Post-op Assessment: Report given to RN and Post -op Vital signs reviewed and stable  Post vital signs: Reviewed and stable  Last Vitals:  Vitals Value Taken Time  BP 142/93 04/29/24 11:49  Temp    Pulse 97 04/29/24 11:52  Resp 18 04/29/24 11:53  SpO2 100 % 04/29/24 11:52  Vitals shown include unfiled device data.  Last Pain:  Vitals:   04/29/24 0626  TempSrc: Oral         Complications: No notable events documented.

## 2024-04-30 ENCOUNTER — Encounter (HOSPITAL_COMMUNITY): Payer: Self-pay | Admitting: Urology

## 2024-04-30 DIAGNOSIS — C61 Malignant neoplasm of prostate: Secondary | ICD-10-CM | POA: Diagnosis not present

## 2024-04-30 LAB — BASIC METABOLIC PANEL WITH GFR
Anion gap: 10 (ref 5–15)
BUN: 8 mg/dL (ref 8–23)
CO2: 22 mmol/L (ref 22–32)
Calcium: 8.4 mg/dL — ABNORMAL LOW (ref 8.9–10.3)
Chloride: 98 mmol/L (ref 98–111)
Creatinine, Ser: 0.8 mg/dL (ref 0.61–1.24)
GFR, Estimated: >60 mL/min (ref >60)
Glucose, Bld: 136 mg/dL — ABNORMAL HIGH (ref 70–99)
Potassium: 3.4 mmol/L — ABNORMAL LOW (ref 3.5–5.1)
Sodium: 130 mmol/L — ABNORMAL LOW (ref 135–145)

## 2024-04-30 LAB — CREATININE, FLUID (PLEURAL, PERITONEAL, JP DRAINAGE): Creat, Fluid: 44.6 mg/dL

## 2024-04-30 LAB — HEMOGLOBIN AND HEMATOCRIT, BLOOD
HCT: 37.3 % — ABNORMAL LOW (ref 39.0–52.0)
Hemoglobin: 13.2 g/dL (ref 13.0–17.0)

## 2024-04-30 MED ORDER — CHLORHEXIDINE GLUCONATE CLOTH 2 % EX PADS
6.0000 | MEDICATED_PAD | Freq: Every day | CUTANEOUS | Status: DC
  Administered 2024-04-30: 6 via TOPICAL

## 2024-04-30 NOTE — Op Note (Signed)
 NAME: Rodney Buchanan MEDICAL RECORD NO: [id number] ACCOUNT NO: [id number] DATE OF BIRTH: 1960/12/13 FACILITY: [facility]ATION: [id number] PHYSICIAN: Ricardo Likens, MD  Operative Report   DATE OF PROCEDURE: 04/29/2024  PREOPERATIVE DIAGNOSES: 1.  High RIsk prostate cancer. 2.  Left inguinal hernia.  PROCEDURES PERFORMED: 1.  Robotic-assisted laparoscopic radical prostatectomy. 2.  Pelvic lymph node dissection, bilateral. 3.  Sentinel lymph node mapping. 4.  Left laparoscopic inguinal hernia repair.  ESTIMATED BLOOD LOSS:  100 mL.  COMPLICATIONS:  None.  SPECIMENS: 1.  Right external lymph nodes. 2.  Right obturator lymph nodes. 3.  Right internal lymph nodes. 4.  Left external lymph nodes. 5.  Left obturator lymph nodes. 6.  Radical prostatectomy.  ASSISTANT:  Alan Hammonds, PA  FINDINGS: 1.  Sentinel lymph nodes noted bilaterally, denoted on pathology requisition. 2.  Direct left inguinal hernia.  INDICATIONS:  The patient, Rodney Buchanan, who goes by [employer], is a 63 year old man, that presented with elevated PSA and multifocal carcinoma of the prostate.  This is clinically localized on imaging.  He does has some vascular comorbidity, but excellent functional  status.  After consideration of therapeutic options, he elected to undergo a radical prostatectomy.  His prostate is somewhat large with a median lobe, which would make potential radiation problematic in terms of future obstruction.  He was also noted  on staging imaging to have a left inguinal hernia containing some bowel, but fortunately, no history of strangulation.  He wished to have this repaired concomitantly.  Informed consent was obtained and placed in the medical record for radical  prostatectomy with left inguinal hernia repair.  DESCRIPTION OF PROCEDURE:  The patient being, Rodney Buchanan was verified and the procedure being radical prostatectomy with left inguinal hernia repair was confirmed.  Procedure timeout was  performed.  Intravenous antibiotics were administered.   General endotracheal anesthesia was induced.  The patient was placed into a low lithotomy position.  A sterile field was created, prepping and draping the patient's penis, perineum, and proximal thighs using iodine and his infra-xiphoid abdomen using  chlorhexidine  gluconate after clipper shaving.  He was further fastened to the operating table with 3 inch tape over foam padding across the supraxiphoid chest.  His arms were secured at his side with gel rolls.  Test of steep Trendelenburg positioning  was performed.  He was found to be suitably positioned.  A Foley catheter was placed per urethra to straight drain and high-flow low pressure pneumoperitoneum was obtained using Veress technique in the supraumbilical midline after passing the aspiration  and drop test.  An 8-mm robotic camera port was then placed in the same location.  Laparoscopic examination of the peritoneal cavity showed no significant adhesions and no visceral injury.  Additional ports were placed as follows:  Right paramedian 8-mm robotic port,  right far lateral 12-mm AirSeal assist port, right paramedian 5-mm suction port, left paramedian 8-mm robotic port, left far lateral 8-mm robotic port.  The robot was docked and passed the electronic checks.   Initial attention was directed at the development of the space of Retzius.  An incision was made lateral to the right medial umbilical ligament from the area of the midline towards the area of the internal ring, coursing along the iliac vessels towards  the area of the right ureter, which was positively identified.  The right vas deferens was encountered, ligated, and used as a medial bucket handle, and then the right bladder was also ablated from the pelvic sidewall towards the area  of the endopelvic  fascia on the right side.  A mirror image dissection was performed on the left side.  The patient's inguinal hernia was easily noted.   This was a direct type with a defect approximately 3.5 cm that was medial to the epigastrics.  At this point, it was  not containing any bowel contents and appeared simple.   Anterior attachments were taken down with cautery and scissors to expose the anterior base of the prostate.  It was defatted to better note the bladder, prostate neck junction.  Next, 0.2 mL of indocyanine green dye was injected easily within the  prostate using a percutaneously placed robotically guided spinal needle for sentinel lymph node mapping, which showed excellent parenchymal uptake of the dye.  Next, the endopelvic fascia was then swept away from the lateral aspect of the prostate at the  base of apex orientation.  This exposed dorsal venous complex that was carefully controlled using green load stapler, taking exquisite care to avoid membranous urethral injury.   membranous urethral injury.  It had been approximately 10 minutes post-dye injection, and the pelvis was inspected under near-infrared fluorescence light.  Sentinel lymph node mapping revealed excellent parenchymal uptake of the prostate and several lymphatic channels coursing bilaterally, more on the right than left. A standard template lymph node dissection was then performed on the right side first, the  right external iliac group, with the boundaries being the right external iliac artery, vein, and pelvic sidewall, lymphostasis achieved with cold clips and set aside labeled as right external iliac lymph nodes.  Next, the right obturator group was dissected free with boundaries being the right external iliac vein, pelvic side wall, and obturator nerve.  Lymphostasis achieved with cold clips and set aside labeled as right obturator lymph nodes.  There was  additional sentinel lymphatic tissue noted in the right internal iliac area.  This was carefully dissected free and set aside labeled as such.    A mirror image lymphadenectomy was performed on the left  side, the left external iliac and left obturator group respectively.  Attention was then directed to bladder neck dissection.  The bladder neck was identified by moving the Foley catheter back and forth, and a lateral release was performed on each side to better denote the bladder neck prostate junction, and the bladder  neck was separated from the prostate in the anterior-posterior direction, keeping what appeared to be rim of circular muscle fibers with each side of dissection.  As anticipated, there was a small median lobe.  This was delivered via the bladder neck.  A  figure-of-eight Vicryl stay suture was placed into this to allow for countertraction.   Posterior dissection was performed by incising approximately 7 mm inferior posterior to the posterior lip of the prostate and median lobe area, entering the plane of Denonvilliers.  Bilateral vas deferens were encountered, dissected for distance of  approximately 3 cm, ligated, and placed on gentle superior traction.  Bilateral seminal vesicles were dissected at their tip and placed on gentle superior traction.  Dissection continued in this posterior plane towards the area of the apex of the prostate, which was denoted by anterior curvature.  This exposed the vascular pedicles which were controlled using sequential clipping technique and base to apex orientation  performing mild nerve sparing bilaterally given the high-risk indication. A final apical dissection was performed in the anterior plane, placing the prostate in gentle suprapubic traction, transecting the membranous urethra coldly.  This completely  freed  up the prostatectomy specimen, was placed in an EndoCatch bag for later retrieval.  A digital rectal exam was performed using an indicator glove under laparoscopic vision, and no evidence of rectal dilation was noted. Posterior reconstruction was  performed beginning with 3-0 V-Loc suture, reapproximating the posterior urethral plate to the  posterior bladder neck, bringing the structures to tension-free apposition.  Finally, mucosa-to-mucosa bladder and urethral anastomosis were performed using a  double-arm V-Loc suture from the 6 o'clock to 12 o'clock position, which showed excellent tension-free apposition.  The Foley catheter was placed and irrigated quantitatively.  Sponge and needle counts were correct.  Hemostasis was excellent.   Attention was then directed at the repair of the left direct inguinal hernia.  A figure-of-eight Ethibond was used, reapproximating the pectinate line of the pubic bone to the true fascia x 2.  This resulted in excellent resolution of the hernia defect.   I was quite happy with this.   The left lateral most robotic port site was then removed.  A closed suction drain was brought through this into the peritoneal cavity.  The robot was then undocked.    The previous right lateral most assistant port site was closed at the level of the fascia using a Carter-Thomason suture passer with #0 Vicryl.   The specimen was retrieved by extending the camera port site superiorly and inferiorly for a total distance approximately 3 cm, removing the prostatectomy specimen, set aside for permanent pathology.  The extraction site was closed at the level of the  fascia using figure-of-eight PDS x 4 followed by reapproximation of Scarpa's with running Vicryl.  All incision sites were infiltrated with dilute lipolyzed Marcaine  and closed at the level of the skin using subcuticular Monocryl followed by Dermabond.   The procedure was then terminated.  The patient tolerated the procedure well.  No immediate periprocedural complications.  The patient was taken to the postanesthesia care unit in stable condition.  Plan for observation and admission.   Please note, first assistant Alan Hammonds was crucial for all portions of the surgery today.  She provided invaluable retraction, suctioning, vascular clipping, vascular stapling,  robotic instrument exchange, and general first assistance.   MUK D: 04/29/2024 11:57:34 am T: 04/30/2024 2:54:00 am  JOB: [id number]/ [id number]

## 2024-04-30 NOTE — Progress Notes (Signed)
 1 Day Post-Op Subjective: The patient is doing well.  No nausea or vomiting. Pain is adequately controlled.  Objective: Vital signs in last 24 hours: Temp:  [97.9 F (36.6 C)-99.7 F (37.6 C)] 99.1 F (37.3 C) (08/14 0941) Pulse Rate:  [71-92] 75 (08/14 0941) Resp:  [13-22] 16 (08/14 0941) BP: (131-161)/(81-104) 143/90 (08/14 0941) SpO2:  [93 %-100 %] 97 % (08/14 0941) Arterial Line BP: (141-179)/(73-90) 176/89 (08/13 1345)  Intake/Output from previous day: 08/13 0701 - 08/14 0700 In: 2144.5 [P.O.:360; I.V.:1784.5] Out: 4255 [Urine:3700; Drains:425; Blood:130] Intake/Output this shift: Total I/O In: -  Out: 275 [Urine:200; Drains:75]  Physical Exam:  General: Alert and oriented. CV: RRR Lungs: Clear bilaterally. GI: Soft, Nondistended. Incisions: Clean, dry, and intact Urine: Clear Extremities: Nontender, no erythema, no edema.  Lab Results: Recent Labs    04/27/24 1438 04/29/24 1214 04/30/24 0352  HGB 15.3 14.3 13.2  HCT 44.9 40.8 37.3*      Assessment/Plan: POD# 1 s/p robotic prostatectomy.  1) SL IVF 2) Ambulate, Incentive spirometry 3) Transition to oral pain medication 4) JP Cr slightly elevated, will plan to observe and repeat tomorrow 5) continue to trend output    LOS: 0 days   Rodney Buchanan 04/30/2024, 11:55 AM

## 2024-05-01 DIAGNOSIS — C61 Malignant neoplasm of prostate: Secondary | ICD-10-CM | POA: Diagnosis not present

## 2024-05-01 LAB — BASIC METABOLIC PANEL WITH GFR
Anion gap: 13 (ref 5–15)
BUN: 9 mg/dL (ref 8–23)
CO2: 22 mmol/L (ref 22–32)
Calcium: 9.4 mg/dL (ref 8.9–10.3)
Chloride: 98 mmol/L (ref 98–111)
Creatinine, Ser: 0.88 mg/dL (ref 0.61–1.24)
GFR, Estimated: >60 mL/min (ref >60)
Glucose, Bld: 129 mg/dL — ABNORMAL HIGH (ref 70–99)
Potassium: 3.6 mmol/L (ref 3.5–5.1)
Sodium: 133 mmol/L — ABNORMAL LOW (ref 135–145)

## 2024-05-01 LAB — CREATININE, FLUID (PLEURAL, PERITONEAL, JP DRAINAGE): Creat, Fluid: 0.8 mg/dL

## 2024-05-01 NOTE — Discharge Summary (Signed)
 Date of admission: 04/29/2024  Date of discharge: 05/01/2024  Admission diagnosis: Prostate Cancer  Discharge diagnosis: Prostate Cancer  History and Physical: For full details, please see admission history and physical. Briefly, Rodney Buchanan is a 63 y.o. gentleman with localized prostate cancer.  After discussing management/treatment options, he elected to proceed with surgical treatment.  Hospital Course: Rodney Buchanan was taken to the operating room on 04/29/2024 and underwent a robotic assisted laparoscopic radical prostatectomy. He tolerated this procedure well and without complications. Postoperatively, he was able to be transferred to a regular hospital room following recovery from anesthesia.  He was able to begin ambulating the night of surgery. He remained hemodynamically stable overnight.  He had excellent urine output with somewhat elevated output from his JP drain, JP cr was elevated on POD1 however it had resolved by POD2 and so his pelvic drain was removed on POD #2.  He was transitioned to oral pain medication, tolerated a clear liquid diet, and had met all discharge criteria and was able to be discharged home later on POD#2.  Laboratory values:  Recent Labs    04/29/24 1214 04/30/24 0352  HGB 14.3 13.2  HCT 40.8 37.3*    Disposition: Home  Discharge instruction: He was instructed to be ambulatory but to refrain from heavy lifting, strenuous activity, or driving. He was instructed on urethral catheter care.  Discharge medications:   Allergies as of 05/01/2024   No Known Allergies      Medication List     STOP taking these medications    solifenacin 5 MG tablet Commonly known as: VESICARE       TAKE these medications    amLODipine  5 MG tablet Commonly known as: NORVASC  TAKE 1 TABLET BY MOUTH DAILY   atorvastatin  20 MG tablet Commonly known as: Lipitor Take 1 tablet (20 mg total) by mouth daily.   docusate sodium  100 MG capsule Commonly known as:  COLACE Take 1 capsule (100 mg total) by mouth 2 (two) times daily.   HYDROcodone -acetaminophen  5-325 MG tablet Commonly known as: NORCO/VICODIN Take 1-2 tablets by mouth every 6 (six) hours as needed for moderate pain (pain score 4-6) or severe pain (pain score 7-10).   sulfamethoxazole -trimethoprim  800-160 MG tablet Commonly known as: BACTRIM  DS Take 1 tablet by mouth 2 (two) times daily. Start the day prior to foley removal appointment        Followup: He will followup in 1 week for catheter removal and to discuss his surgical pathology results.

## 2024-05-04 LAB — SURGICAL PATHOLOGY

## 2024-05-11 DIAGNOSIS — N393 Stress incontinence (female) (male): Secondary | ICD-10-CM | POA: Diagnosis not present

## 2024-10-11 ENCOUNTER — Other Ambulatory Visit: Payer: Self-pay | Admitting: Cardiovascular Disease
